# Patient Record
Sex: Female | Born: 1960 | Race: White | Hispanic: No | Marital: Married | Smoking: Never smoker
Health system: Southern US, Community
[De-identification: ages and names within clinical notes are randomized; demographics above are authoritative.]

## PROBLEM LIST (undated history)

## (undated) DIAGNOSIS — Z87442 Personal history of urinary calculi: Secondary | ICD-10-CM

## (undated) DIAGNOSIS — Z973 Presence of spectacles and contact lenses: Secondary | ICD-10-CM

## (undated) DIAGNOSIS — N201 Calculus of ureter: Secondary | ICD-10-CM

## (undated) DIAGNOSIS — K219 Gastro-esophageal reflux disease without esophagitis: Secondary | ICD-10-CM

## (undated) HISTORY — PX: TUBAL LIGATION: SHX77

## (undated) HISTORY — PX: PICC LINE INSERTION: CATH118290

---

## 1992-07-01 HISTORY — PX: LAPAROSCOPIC TUBAL LIGATION: SUR803

## 1998-07-01 HISTORY — PX: OTHER SURGICAL HISTORY: SHX169

## 1999-07-02 HISTORY — PX: OTHER SURGICAL HISTORY: SHX169

## 2012-04-22 ENCOUNTER — Other Ambulatory Visit (HOSPITAL_COMMUNITY)
Admission: RE | Admit: 2012-04-22 | Discharge: 2012-04-22 | Disposition: A | Discharge status: Home / Self Care | Payer: BC Managed Care – PPO | Source: Ambulatory Visit | Attending: Family Medicine | Admitting: Family Medicine

## 2012-04-22 DIAGNOSIS — Z124 Encounter for screening for malignant neoplasm of cervix: Secondary | ICD-10-CM | POA: Insufficient documentation

## 2012-04-28 ENCOUNTER — Other Ambulatory Visit: Payer: Self-pay | Admitting: Family Medicine

## 2012-04-28 DIAGNOSIS — Z1231 Encounter for screening mammogram for malignant neoplasm of breast: Secondary | ICD-10-CM

## 2012-05-25 ENCOUNTER — Ambulatory Visit
Admission: RE | Admit: 2012-05-25 | Discharge: 2012-05-25 | Disposition: A | Discharge status: Home / Self Care | Payer: BC Managed Care – PPO | Source: Ambulatory Visit | Attending: Family Medicine | Admitting: Family Medicine

## 2012-05-25 DIAGNOSIS — Z1231 Encounter for screening mammogram for malignant neoplasm of breast: Secondary | ICD-10-CM

## 2013-07-07 ENCOUNTER — Other Ambulatory Visit: Payer: Self-pay

## 2013-07-07 DIAGNOSIS — Z1231 Encounter for screening mammogram for malignant neoplasm of breast: Secondary | ICD-10-CM

## 2013-07-26 ENCOUNTER — Ambulatory Visit
Admission: RE | Admit: 2013-07-26 | Discharge: 2013-07-26 | Disposition: A | Discharge status: Home / Self Care | Payer: BC Managed Care – PPO | Source: Ambulatory Visit

## 2013-07-26 DIAGNOSIS — Z1231 Encounter for screening mammogram for malignant neoplasm of breast: Secondary | ICD-10-CM

## 2013-07-29 ENCOUNTER — Other Ambulatory Visit: Payer: Self-pay | Admitting: Family Medicine

## 2013-07-29 DIAGNOSIS — R928 Other abnormal and inconclusive findings on diagnostic imaging of breast: Secondary | ICD-10-CM

## 2013-07-30 ENCOUNTER — Ambulatory Visit
Admission: RE | Admit: 2013-07-30 | Discharge: 2013-07-30 | Disposition: A | Discharge status: Home / Self Care | Payer: BC Managed Care – PPO | Source: Ambulatory Visit | Attending: Family Medicine | Admitting: Family Medicine

## 2013-07-30 DIAGNOSIS — R928 Other abnormal and inconclusive findings on diagnostic imaging of breast: Secondary | ICD-10-CM

## 2013-08-09 ENCOUNTER — Other Ambulatory Visit: Payer: BC Managed Care – PPO

## 2015-05-29 ENCOUNTER — Other Ambulatory Visit (HOSPITAL_COMMUNITY)
Admission: RE | Admit: 2015-05-29 | Discharge: 2015-05-29 | Disposition: A | Discharge status: Home / Self Care | Payer: BLUE CROSS/BLUE SHIELD | Source: Ambulatory Visit | Attending: Family Medicine | Admitting: Family Medicine

## 2015-05-29 ENCOUNTER — Other Ambulatory Visit: Payer: Self-pay | Admitting: Family Medicine

## 2015-05-29 DIAGNOSIS — Z124 Encounter for screening for malignant neoplasm of cervix: Secondary | ICD-10-CM | POA: Diagnosis not present

## 2015-05-30 LAB — CYTOLOGY - PAP

## 2016-01-09 ENCOUNTER — Emergency Department (HOSPITAL_BASED_OUTPATIENT_CLINIC_OR_DEPARTMENT_OTHER)
Admission: EM | Admit: 2016-01-09 | Discharge: 2016-01-09 | Disposition: A | Discharge status: Home / Self Care | Payer: No Typology Code available for payment source | Attending: Emergency Medicine | Admitting: Emergency Medicine

## 2016-01-09 ENCOUNTER — Encounter (HOSPITAL_BASED_OUTPATIENT_CLINIC_OR_DEPARTMENT_OTHER): Payer: Self-pay | Admitting: *Deleted

## 2016-01-09 ENCOUNTER — Emergency Department (HOSPITAL_BASED_OUTPATIENT_CLINIC_OR_DEPARTMENT_OTHER): Payer: No Typology Code available for payment source

## 2016-01-09 DIAGNOSIS — N200 Calculus of kidney: Secondary | ICD-10-CM | POA: Insufficient documentation

## 2016-01-09 DIAGNOSIS — R1032 Left lower quadrant pain: Secondary | ICD-10-CM | POA: Diagnosis present

## 2016-01-09 LAB — CBC WITH DIFFERENTIAL/PLATELET
Basophils Absolute: 0 10*3/uL (ref 0.0–0.1)
Basophils Relative: 1 %
Eosinophils Absolute: 0.2 10*3/uL (ref 0.0–0.7)
Eosinophils Relative: 2 %
HCT: 48 % — ABNORMAL HIGH (ref 36.0–46.0)
Hemoglobin: 15.8 g/dL — ABNORMAL HIGH (ref 12.0–15.0)
Lymphocytes Relative: 28 %
Lymphs Abs: 2.4 10*3/uL (ref 0.7–4.0)
MCH: 29.2 pg (ref 26.0–34.0)
MCHC: 32.9 g/dL (ref 30.0–36.0)
MCV: 88.6 fL (ref 78.0–100.0)
Monocytes Absolute: 0.6 10*3/uL (ref 0.1–1.0)
Monocytes Relative: 7 %
Neutro Abs: 5.4 10*3/uL (ref 1.7–7.7)
Neutrophils Relative %: 62 %
Platelets: 270 10*3/uL (ref 150–400)
RBC: 5.42 MIL/uL — ABNORMAL HIGH (ref 3.87–5.11)
RDW: 14.2 % (ref 11.5–15.5)
WBC: 8.7 10*3/uL (ref 4.0–10.5)

## 2016-01-09 LAB — COMPREHENSIVE METABOLIC PANEL
ALT: 74 U/L — ABNORMAL HIGH (ref 14–54)
AST: 51 U/L — ABNORMAL HIGH (ref 15–41)
Albumin: 4.6 g/dL (ref 3.5–5.0)
Alkaline Phosphatase: 65 U/L (ref 38–126)
Anion gap: 8 (ref 5–15)
BUN: 16 mg/dL (ref 6–20)
CO2: 25 mmol/L (ref 22–32)
Calcium: 10.5 mg/dL — ABNORMAL HIGH (ref 8.9–10.3)
Chloride: 107 mmol/L (ref 101–111)
Creatinine, Ser: 0.96 mg/dL (ref 0.44–1.00)
GFR calc Af Amer: >60 mL/min (ref >60)
GFR calc non Af Amer: >60 mL/min (ref >60)
Glucose, Bld: 156 mg/dL — ABNORMAL HIGH (ref 65–99)
Potassium: 4.1 mmol/L (ref 3.5–5.1)
Sodium: 140 mmol/L (ref 135–145)
Total Bilirubin: 1 mg/dL (ref 0.3–1.2)
Total Protein: 7.1 g/dL (ref 6.5–8.1)

## 2016-01-09 LAB — URINALYSIS, ROUTINE W REFLEX MICROSCOPIC
Bilirubin Urine: NEGATIVE
Glucose, UA: NEGATIVE mg/dL
Ketones, ur: NEGATIVE mg/dL
Leukocytes, UA: NEGATIVE
Nitrite: NEGATIVE
Protein, ur: NEGATIVE mg/dL
Specific Gravity, Urine: 1.02 (ref 1.005–1.030)
pH: 5.5 (ref 5.0–8.0)

## 2016-01-09 LAB — URINE MICROSCOPIC-ADD ON

## 2016-01-09 MED ORDER — TAMSULOSIN HCL 0.4 MG PO CAPS: 0.4000 mg | ORAL_CAPSULE | Freq: Every day | ORAL | Status: DC

## 2016-01-09 MED ORDER — KETOROLAC TROMETHAMINE 15 MG/ML IJ SOLN
15.0000 mg | Freq: Once | INTRAMUSCULAR | Status: AC
  Administered 2016-01-09: 15 mg via INTRAVENOUS
  Filled 2016-01-09: qty 1

## 2016-01-09 MED ORDER — FENTANYL CITRATE (PF) 100 MCG/2ML IJ SOLN
50.0000 ug | Freq: Once | INTRAMUSCULAR | Status: AC
  Administered 2016-01-09: 50 ug via INTRAVENOUS
  Filled 2016-01-09: qty 2

## 2016-01-09 MED ORDER — ONDANSETRON HCL 4 MG/2ML IJ SOLN
4.0000 mg | Freq: Once | INTRAMUSCULAR | Status: AC
  Administered 2016-01-09: 4 mg via INTRAVENOUS
  Filled 2016-01-09: qty 2

## 2016-01-09 MED ORDER — HYDROCODONE-ACETAMINOPHEN 5-325 MG PO TABS: 1.0000 | ORAL_TABLET | Freq: Four times a day (QID) | ORAL | Status: DC | PRN

## 2016-01-09 MED ORDER — NAPROXEN 500 MG PO TABS: 500.0000 mg | ORAL_TABLET | Freq: Three times a day (TID) | ORAL | Status: DC

## 2016-01-09 MED ORDER — ONDANSETRON 4 MG PO TBDP
4.0000 mg | ORAL_TABLET | Freq: Three times a day (TID) | ORAL | Status: DC | PRN
Start: 2016-01-09 — End: 2020-08-15

## 2016-01-09 MED ORDER — MORPHINE SULFATE (PF) 4 MG/ML IV SOLN
4.0000 mg | Freq: Once | INTRAVENOUS | Status: AC
  Administered 2016-01-09: 4 mg via INTRAVENOUS
  Filled 2016-01-09: qty 1

## 2016-01-09 MED ORDER — SODIUM CHLORIDE 0.9 % IV BOLUS (SEPSIS)
1000.0000 mL | Freq: Once | INTRAVENOUS | Status: AC
  Administered 2016-01-09: 1000 mL via INTRAVENOUS

## 2016-01-09 MED ORDER — HYDROMORPHONE HCL 1 MG/ML IJ SOLN
1.0000 mg | Freq: Once | INTRAMUSCULAR | Status: DC
  Filled 2016-01-09: qty 1

## 2016-01-09 MED FILL — TAMSULOSIN HCL 0.4 MG CAP: 0.4 | 30 days supply | Qty: 30 | Fill #0

## 2016-01-09 MED FILL — ONDANSETRON ODT 4 MG TABLET: 4 | 30 days supply | Qty: 15 | Fill #0

## 2016-01-09 MED FILL — NAPROXEN 500 MG TABLET: 500 | 10 days supply | Qty: 30 | Fill #0

## 2016-01-09 MED FILL — HYDROCODON-APAP 5-325: 5-325 | 3 days supply | Qty: 14 | Fill #0

## 2016-01-09 NOTE — ED Notes (Signed)
Pt reports sudden onset of abd pain LLQ with nausea and vomiting

## 2016-01-09 NOTE — Discharge Instructions (Signed)
Kidney Stones °Kidney stones (urolithiasis) are deposits that form inside your kidneys. The intense pain is caused by the stone moving through the urinary tract. When the stone moves, the ureter goes into spasm around the stone. The stone is usually passed in the urine.  °CAUSES  °· A disorder that makes certain neck glands produce too much parathyroid hormone (primary hyperparathyroidism). °· A buildup of uric acid crystals, similar to gout in your joints. °· Narrowing (stricture) of the ureter. °· A kidney obstruction present at birth (congenital obstruction). °· Previous surgery on the kidney or ureters. °· Numerous kidney infections. °SYMPTOMS  °· Feeling sick to your stomach (nauseous). °· Throwing up (vomiting). °· Blood in the urine (hematuria). °· Pain that usually spreads (radiates) to the groin. °· Frequency or urgency of urination. °DIAGNOSIS  °· Taking a history and physical exam. °· Blood or urine tests. °· CT scan. °· Occasionally, an examination of the inside of the urinary bladder (cystoscopy) is performed. °TREATMENT  °· Observation. °· Increasing your fluid intake. °· Extracorporeal shock wave lithotripsy--This is a noninvasive procedure that uses shock waves to break up kidney stones. °· Surgery may be needed if you have severe pain or persistent obstruction. There are various surgical procedures. Most of the procedures are performed with the use of small instruments. Only small incisions are needed to accommodate these instruments, so recovery time is minimized. °The size, location, and chemical composition are all important variables that will determine the proper choice of action for you. Talk to your health care provider to better understand your situation so that you will minimize the risk of injury to yourself and your kidney.  °HOME CARE INSTRUCTIONS  °· Drink enough water and fluids to keep your urine clear or pale yellow. This will help you to pass the stone or stone fragments. °· Strain  all urine through the provided strainer. Keep all particulate matter and stones for your health care provider to see. The stone causing the pain may be as small as a grain of salt. It is very important to use the strainer each and every time you pass your urine. The collection of your stone will allow your health care provider to analyze it and verify that a stone has actually passed. The stone analysis will often identify what you can do to reduce the incidence of recurrences. °· Only take over-the-counter or prescription medicines for pain, discomfort, or fever as directed by your health care provider. °· Keep all follow-up visits as told by your health care provider. This is important. °· Get follow-up X-rays if required. The absence of pain does not always mean that the stone has passed. It may have only stopped moving. If the urine remains completely obstructed, it can cause loss of kidney function or even complete destruction of the kidney. It is your responsibility to make sure X-rays and follow-ups are completed. Ultrasounds of the kidney can show blockages and the status of the kidney. Ultrasounds are not associated with any radiation and can be performed easily in a matter of minutes. °· Make changes to your daily diet as told by your health care provider. You may be told to: °¨ Limit the amount of salt that you eat. °¨ Eat 5 or more servings of fruits and vegetables each day. °¨ Limit the amount of meat, poultry, fish, and eggs that you eat. °· Collect a 24-hour urine sample as told by your health care provider. You may need to collect another urine sample every 6-12   months. °SEEK MEDICAL CARE IF: °· You experience pain that is progressive and unresponsive to any pain medicine you have been prescribed. °SEEK IMMEDIATE MEDICAL CARE IF:  °· Pain cannot be controlled with the prescribed medicine. °· You have a fever or shaking chills. °· The severity or intensity of pain increases over 18 hours and is not  relieved by pain medicine. °· You develop a new onset of abdominal pain. °· You feel faint or pass out. °· You are unable to urinate. °  °This information is not intended to replace advice given to you by your health care provider. Make sure you discuss any questions you have with your health care provider. °  °Document Released: 06/17/2005 Document Revised: 03/08/2015 Document Reviewed: 11/18/2012 °Elsevier Interactive Patient Education ©2016 [employer] ° °

## 2016-01-09 NOTE — ED Provider Notes (Signed)
CSN: [id number]     Arrival date & time 01/09/16  4098 History   First MD Initiated Contact with Patient 01/09/16 [phone]     Chief Complaint  Patient presents with  . Flank Pain     (Consider location/radiation/quality/duration/timing/severity/associated sxs/prior Treatment) HPI   LLq pain began at 4AM, associated n/v, combination of sharp/dull/cramping. Waxing and waning however constant.  THought it was gas and took tums which did not help.  Severe pain.   No diarrhea  History reviewed. No pertinent past medical history. Past Surgical History  Procedure Laterality Date  . Tubal ligation    . Fracture surgery     History reviewed. No pertinent family history. Social History  Substance Use Topics  . Smoking status: Never Smoker   . Smokeless tobacco: Never Used  . Alcohol Use: No   OB History    No data available     Review of Systems  Constitutional: Negative for fever.  HENT: Negative for sore throat.   Eyes: Negative for visual disturbance.  Respiratory: Negative for cough and shortness of breath.   Cardiovascular: Negative for chest pain.  Gastrointestinal: Positive for nausea, vomiting and abdominal pain. Negative for diarrhea and constipation.  Genitourinary: Positive for flank pain. Negative for dysuria and difficulty urinating.  Musculoskeletal: Negative for back pain and neck pain.  Skin: Negative for rash.  Neurological: Negative for syncope and headaches.      Allergies  Ibuprofen  Home Medications   Prior to Admission medications   Medication Sig Start Date End Date Taking? Authorizing Provider  HYDROcodone-acetaminophen (NORCO) 5-325 MG tablet Take 1 tablet by mouth every 6 (six) hours as needed for moderate pain. 01/09/16   Alvira Monday, MD  naproxen (NAPROSYN) 500 MG tablet Take 1 tablet (500 mg total) by mouth 3 (three) times daily with meals. 01/09/16   Alvira Monday, MD  ondansetron (ZOFRAN ODT) 4 MG disintegrating tablet Take 1 tablet (4 mg  total) by mouth every 8 (eight) hours as needed for nausea or vomiting. 01/09/16   Alvira Monday, MD  tamsulosin (FLOMAX) 0.4 MG CAPS capsule Take 1 capsule (0.4 mg total) by mouth daily. 01/09/16   Alvira Monday, MD   BP 157/80 mmHg  Pulse 69  Temp(Src) 98.4 F (36.9 C) (Oral)  Resp 18  Ht  (1.575 m)  Wt 252 lb (114.306 kg)  BMI 46.08 kg/m2  SpO2 94% Physical Exam  Constitutional: She is oriented to person, place, and time. She appears well-developed and well-nourished. No distress.  HENT:  Head: Normocephalic and atraumatic.  Eyes: Conjunctivae and EOM are normal.  Neck: Normal range of motion.  Cardiovascular: Normal rate, regular rhythm, normal heart sounds and intact distal pulses.  Exam reveals no gallop and no friction rub.   No murmur heard. Pulmonary/Chest: Effort normal and breath sounds normal. No respiratory distress. She has no wheezes. She has no rales.  Abdominal: Soft. She exhibits no distension. There is no tenderness. There is no guarding and no CVA tenderness.  Musculoskeletal: She exhibits no edema or tenderness.  Neurological: She is alert and oriented to person, place, and time.  Skin: Skin is warm and dry. No rash noted. She is not diaphoretic. No erythema.  Nursing note and vitals reviewed.   ED Course  Procedures (including critical care time) Labs Review Labs Reviewed  URINALYSIS, ROUTINE W REFLEX MICROSCOPIC (NOT AT [facility]) - Abnormal; Notable for the following:    APPearance CLOUDY (*)    Hgb urine dipstick LARGE (*)  All other components within normal limits  URINE MICROSCOPIC-ADD ON - Abnormal; Notable for the following:    Squamous Epithelial / LPF 6-30 (*)    Bacteria, UA MANY (*)    All other components within normal limits  CBC WITH DIFFERENTIAL/PLATELET - Abnormal; Notable for the following:    RBC 5.42 (*)    Hemoglobin 15.8 (*)    HCT 48.0 (*)    All other components within normal limits  COMPREHENSIVE METABOLIC PANEL -  Abnormal; Notable for the following:    Glucose, Bld 156 (*)    Calcium 10.5 (*)    AST 51 (*)    ALT 74 (*)    All other components within normal limits    Imaging Review Ct Renal Stone Study  01/09/2016  CLINICAL DATA:  Sudden onset of LEFT lower quadrant abdominal pain with nausea and vomiting EXAM: CT ABDOMEN AND PELVIS WITHOUT CONTRAST TECHNIQUE: Multidetector CT imaging of the abdomen and pelvis was performed following the standard protocol without IV contrast. Sagittal and coronal MPR images reconstructed from axial data set. COMPARISON:  None. FINDINGS: Lower chest:  Dependent atelectasis posterior RIGHT lower lobe base. Hepatobiliary: Gallbladder unremarkable. Probable fatty infiltration of liver with focal sparing adjacent to gallbladder fossa. Small low-attenuation hepatic foci consistent with cysts by attenuation measurement largest 12 mm greatest diameter. Pancreas: Normal appearance Spleen: Normal appearance Adrenals/Urinary Tract: Adrenal glands normal appearance. Mild fetal lobulation of kidneys. LEFT hydronephrosis, LEFT renal enlargement and mild peripelvic edema secondary to a 4 mm proximal LEFT ureteral calculus. Ureters otherwise decompressed without additional urinary tract calcification or evidence of RIGHT hydronephrosis. No renal mass. Bladder decompressed. Stomach/Bowel: Normal appendix. Colon un opacified and under distended, unable to exclude areas of wall thickening at several sites most notably distal transverse colon and splenic flexure. Stomach and small bowel loops unremarkable. Vascular/Lymphatic: Aorta normal caliber.  No adenopathy. Reproductive: Prior tubal ligation. Uterus and adnexa otherwise normal appearance. Other: No mass, free air, free fluid, or hernia. Musculoskeletal: Mild degenerative disc and facet disease changes lower lumbar spine. IMPRESSION: LEFT hydronephrosis due to a 4 mm proximal LEFT ureteral calculus. Probable fatty infiltration of liver with focal  sparing adjacent to gallbladder fossa and note of several small hepatic cysts. Electronically Signed   By: Ulyses SouthwardMark  Boles M.D.   On: 01/09/2016 08:06   I have personally reviewed and evaluated these images and lab results as part of my medical decision-making.   EKG Interpretation None      MDM   Final diagnoses:  Nephrolithiasis   55 year old female with no significant medical history presents with concern for left flank and left lower quadrant pain. CT stone study shows 4 mm obstructing nephrolithiasis with associated hydronephrosis. Patient was given morphine, Toradol and Zofran for pain and nausea in the emergency department and 1 L of normal saline. Urinalysis shows likely contamination with multiple squamous cells and many bacteria, however will send for culture.  Pain initially not controlled, and dilaudid ordered, however pain decreased to 0. Patient is given prescriptions for Flomax, naproxen, Norco and Zofran and recommend follow-up with urology if persistent pain and otherwise PCP follow up. Discussed return precautions. Patient discharged in stable condition with understanding of reasons to return.    Alvira MondayErin Lynnix Schoneman, MD 01/09/16 [id number]

## 2016-01-10 LAB — URINE CULTURE

## 2016-01-18 ENCOUNTER — Encounter (HOSPITAL_BASED_OUTPATIENT_CLINIC_OR_DEPARTMENT_OTHER): Payer: Self-pay | Admitting: *Deleted

## 2016-01-18 ENCOUNTER — Other Ambulatory Visit: Payer: Self-pay | Admitting: Urology

## 2016-01-18 NOTE — Progress Notes (Addendum)
NPO AFTER MN.  ARRIVE AT 0715.  NEEDS KUB.  CURRENT LAB RESULTS IN CHART AND EPIC.  WILL TAKE ZANTAC AND FLOMAX AM DOS W/ SIPS OF WATER AND IF NEEDED TAKE HYDROCODONE/ ZOFRAN .

## 2016-01-21 NOTE — Discharge Instructions (Signed)

## 2016-01-22 ENCOUNTER — Ambulatory Visit (HOSPITAL_BASED_OUTPATIENT_CLINIC_OR_DEPARTMENT_OTHER)
Admission: RE | Admit: 2016-01-22 | Payer: No Typology Code available for payment source | Source: Ambulatory Visit | Admitting: Urology

## 2016-01-22 ENCOUNTER — Encounter (HOSPITAL_BASED_OUTPATIENT_CLINIC_OR_DEPARTMENT_OTHER): Payer: Self-pay | Admitting: Anesthesiology

## 2016-01-22 HISTORY — DX: Calculus of ureter: N20.1

## 2016-01-22 HISTORY — DX: Gastro-esophageal reflux disease without esophagitis: K21.9

## 2016-01-22 HISTORY — DX: Presence of spectacles and contact lenses: Z97.3

## 2016-01-22 SURGERY — CYSTOURETEROSCOPY, WITH RETROGRADE PYELOGRAM AND STENT INSERTION
Anesthesia: General | Laterality: Left

## 2016-01-22 NOTE — Anesthesia Preprocedure Evaluation (Deleted)
Anesthesia Evaluation  Patient identified by MRN, date of birth, ID band Patient awake    Reviewed: Allergy & Precautions, NPO status , Patient's Chart, lab work & pertinent test results  History of Anesthesia Complications Negative for: history of anesthetic complications  Airway        Dental   Pulmonary neg pulmonary ROS,           Cardiovascular negative cardio ROS       Neuro/Psych negative neurological ROS  negative psych ROS   GI/Hepatic Neg liver ROS, GERD  Controlled,  Endo/Other  Morbid obesity  Renal/GU negative Renal ROS  negative genitourinary   Musculoskeletal negative musculoskeletal ROS (+)   Abdominal   Peds negative pediatric ROS (+)  Hematology negative hematology ROS (+)   Anesthesia Other Findings   Reproductive/Obstetrics negative OB ROS                             Anesthesia Physical Anesthesia Plan  ASA: III  Anesthesia Plan: General   Post-op Pain Management:    Induction: Intravenous  Airway Management Planned: LMA  Additional Equipment:   Intra-op Plan:   Post-operative Plan: Extubation in OR  Informed Consent: I have reviewed the patients History and Physical, chart, labs and discussed the procedure including the risks, benefits and alternatives for the proposed anesthesia with the patient or authorized representative who has indicated his/her understanding and acceptance.   Dental advisory given  Plan Discussed with: CRNA  Anesthesia Plan Comments:         Anesthesia Quick Evaluation

## 2016-06-03 ENCOUNTER — Other Ambulatory Visit: Payer: Self-pay | Admitting: Family Medicine

## 2016-06-03 DIAGNOSIS — N6320 Unspecified lump in the left breast, unspecified quadrant: Secondary | ICD-10-CM

## 2016-07-03 ENCOUNTER — Other Ambulatory Visit: Payer: No Typology Code available for payment source

## 2016-07-17 ENCOUNTER — Other Ambulatory Visit: Payer: No Typology Code available for payment source

## 2016-07-22 ENCOUNTER — Ambulatory Visit
Admission: RE | Admit: 2016-07-22 | Discharge: 2016-07-22 | Disposition: A | Discharge status: Home / Self Care | Payer: No Typology Code available for payment source | Source: Ambulatory Visit | Attending: Family Medicine | Admitting: Family Medicine

## 2016-07-22 DIAGNOSIS — N6320 Unspecified lump in the left breast, unspecified quadrant: Secondary | ICD-10-CM

## 2018-09-24 ENCOUNTER — Other Ambulatory Visit (HOSPITAL_COMMUNITY)
Admission: RE | Admit: 2018-09-24 | Discharge: 2018-09-24 | Disposition: A | Discharge status: Home / Self Care | Payer: PRIVATE HEALTH INSURANCE | Source: Ambulatory Visit | Attending: Family Medicine | Admitting: Family Medicine

## 2018-09-24 ENCOUNTER — Other Ambulatory Visit: Payer: Self-pay | Admitting: Family Medicine

## 2018-09-24 DIAGNOSIS — Z124 Encounter for screening for malignant neoplasm of cervix: Secondary | ICD-10-CM | POA: Diagnosis not present

## 2018-09-25 LAB — CYTOLOGY - PAP: Diagnosis: NEGATIVE

## 2019-10-05 ENCOUNTER — Ambulatory Visit
Admission: RE | Admit: 2019-10-05 | Discharge: 2019-10-05 | Disposition: A | Discharge status: Home / Self Care | Payer: PRIVATE HEALTH INSURANCE | Source: Ambulatory Visit | Attending: Family Medicine | Admitting: Family Medicine

## 2019-10-05 ENCOUNTER — Other Ambulatory Visit: Payer: Self-pay | Admitting: Family Medicine

## 2019-10-05 DIAGNOSIS — R319 Hematuria, unspecified: Secondary | ICD-10-CM

## 2019-10-05 DIAGNOSIS — M5489 Other dorsalgia: Secondary | ICD-10-CM

## 2019-12-16 ENCOUNTER — Other Ambulatory Visit (HOSPITAL_COMMUNITY): Payer: Self-pay | Admitting: Urology

## 2019-12-16 DIAGNOSIS — R8271 Bacteriuria: Secondary | ICD-10-CM

## 2019-12-17 ENCOUNTER — Other Ambulatory Visit (HOSPITAL_COMMUNITY): Payer: Self-pay

## 2019-12-20 ENCOUNTER — Ambulatory Visit (HOSPITAL_COMMUNITY)
Admission: RE | Admit: 2019-12-20 | Discharge: 2019-12-20 | Disposition: A | Discharge status: Home / Self Care | Payer: No Typology Code available for payment source | Source: Ambulatory Visit | Attending: Urology | Admitting: Urology

## 2019-12-20 ENCOUNTER — Other Ambulatory Visit: Payer: Self-pay

## 2019-12-20 DIAGNOSIS — R8271 Bacteriuria: Secondary | ICD-10-CM | POA: Diagnosis present

## 2019-12-20 MED ORDER — LIDOCAINE HCL 1 % IJ SOLN
INTRAMUSCULAR | Status: AC
  Filled 2019-12-20: qty 20

## 2019-12-20 MED ORDER — HEPARIN SOD (PORK) LOCK FLUSH 100 UNIT/ML IV SOLN: 250.0000 [IU] | INTRAVENOUS | Status: DC | PRN

## 2019-12-20 MED ORDER — HEPARIN SOD (PORK) LOCK FLUSH 100 UNIT/ML IV SOLN
INTRAVENOUS | Status: AC
  Administered 2019-12-20: 250 [IU]
  Filled 2019-12-20: qty 5

## 2019-12-20 MED ORDER — SODIUM CHLORIDE 0.9 % IV SOLN
2.0000 g | Freq: Once | INTRAVENOUS | Status: DC
  Administered 2019-12-20: 2 g via INTRAVENOUS
  Filled 2019-12-20: qty 2

## 2019-12-20 NOTE — Procedures (Signed)
PROCEDURE SUMMARY:  Successful placement of image-guided single lumen PICC line to the right basilic vein. Length 35 cm. Tip at lower SVC/RA. No complications. EBL = <5 ml Ready for use.  Please see imaging section of Epic for full dictation.   Mickie Kay, NP 12/20/2019 11:41 AM

## 2019-12-21 ENCOUNTER — Ambulatory Visit (HOSPITAL_COMMUNITY)
Admission: RE | Admit: 2019-12-21 | Discharge: 2019-12-21 | Disposition: A | Discharge status: Home / Self Care | Payer: PRIVATE HEALTH INSURANCE | Source: Ambulatory Visit | Attending: Urology | Admitting: Urology

## 2019-12-21 DIAGNOSIS — Z452 Encounter for adjustment and management of vascular access device: Secondary | ICD-10-CM | POA: Diagnosis present

## 2019-12-21 LAB — CBC WITH DIFFERENTIAL/PLATELET
Abs Immature Granulocytes: 0.02 10*3/uL (ref 0.00–0.07)
Basophils Absolute: 0 10*3/uL (ref 0.0–0.1)
Basophils Relative: 1 %
Eosinophils Absolute: 0.3 10*3/uL (ref 0.0–0.5)
Eosinophils Relative: 4 %
HCT: 45.4 % (ref 36.0–46.0)
Hemoglobin: 14 g/dL (ref 12.0–15.0)
Immature Granulocytes: 0 %
Lymphocytes Relative: 38 %
Lymphs Abs: 2.5 10*3/uL (ref 0.7–4.0)
MCH: 26.5 pg (ref 26.0–34.0)
MCHC: 30.8 g/dL (ref 30.0–36.0)
MCV: 85.8 fL (ref 80.0–100.0)
Monocytes Absolute: 0.7 10*3/uL (ref 0.1–1.0)
Monocytes Relative: 10 %
Neutro Abs: 3.2 10*3/uL (ref 1.7–7.7)
Neutrophils Relative %: 47 %
Platelets: 284 10*3/uL (ref 150–400)
RBC: 5.29 MIL/uL — ABNORMAL HIGH (ref 3.87–5.11)
RDW: 14.7 % (ref 11.5–15.5)
WBC: 6.6 10*3/uL (ref 4.0–10.5)
nRBC: 0 % (ref 0.0–0.2)

## 2019-12-21 LAB — BASIC METABOLIC PANEL
Anion gap: 9 (ref 5–15)
BUN: 14 mg/dL (ref 6–20)
CO2: 24 mmol/L (ref 22–32)
Calcium: 10.1 mg/dL (ref 8.9–10.3)
Chloride: 108 mmol/L (ref 98–111)
Creatinine, Ser: 0.75 mg/dL (ref 0.44–1.00)
GFR calc Af Amer: >60 mL/min (ref >60)
GFR calc non Af Amer: >60 mL/min (ref >60)
Glucose, Bld: 97 mg/dL (ref 70–99)
Potassium: 3.9 mmol/L (ref 3.5–5.1)
Sodium: 141 mmol/L (ref 135–145)

## 2019-12-21 MED ORDER — SODIUM CHLORIDE 0.9 % IV SOLN
2.0000 g | Freq: Once | INTRAVENOUS | Status: DC
  Administered 2019-12-21: 2 g via INTRAVENOUS
  Filled 2019-12-21: qty 2

## 2019-12-21 MED ORDER — HEPARIN SOD (PORK) LOCK FLUSH 100 UNIT/ML IV SOLN: 250.0000 [IU] | INTRAVENOUS | Status: DC | PRN

## 2019-12-21 MED ORDER — HEPARIN SOD (PORK) LOCK FLUSH 100 UNIT/ML IV SOLN
INTRAVENOUS | Status: AC
  Administered 2019-12-21: 250 [IU]
  Filled 2019-12-21: qty 5

## 2019-12-22 ENCOUNTER — Ambulatory Visit (HOSPITAL_COMMUNITY)
Admission: RE | Admit: 2019-12-22 | Discharge: 2019-12-22 | Disposition: A | Discharge status: Home / Self Care | Payer: PRIVATE HEALTH INSURANCE | Source: Ambulatory Visit | Attending: Urology | Admitting: Urology

## 2019-12-22 ENCOUNTER — Other Ambulatory Visit: Payer: Self-pay

## 2019-12-22 DIAGNOSIS — N3021 Other chronic cystitis with hematuria: Secondary | ICD-10-CM | POA: Insufficient documentation

## 2019-12-22 MED ORDER — SODIUM CHLORIDE 0.9 % IV SOLN
2.0000 g | Freq: Once | INTRAVENOUS | Status: DC
  Administered 2019-12-22: 2 g via INTRAVENOUS

## 2019-12-22 MED ORDER — HEPARIN SOD (PORK) LOCK FLUSH 100 UNIT/ML IV SOLN
250.0000 [IU] | INTRAVENOUS | Status: DC | PRN
  Administered 2019-12-22: 250 [IU]

## 2019-12-23 ENCOUNTER — Other Ambulatory Visit: Payer: Self-pay

## 2019-12-23 ENCOUNTER — Ambulatory Visit (HOSPITAL_COMMUNITY)
Admission: RE | Admit: 2019-12-23 | Discharge: 2019-12-23 | Disposition: A | Discharge status: Home / Self Care | Payer: No Typology Code available for payment source | Source: Ambulatory Visit | Attending: Urology | Admitting: Urology

## 2019-12-23 DIAGNOSIS — B9620 Unspecified Escherichia coli [E. coli] as the cause of diseases classified elsewhere: Secondary | ICD-10-CM | POA: Diagnosis not present

## 2019-12-23 DIAGNOSIS — N3020 Other chronic cystitis without hematuria: Secondary | ICD-10-CM | POA: Insufficient documentation

## 2019-12-23 DIAGNOSIS — Z1612 Extended spectrum beta lactamase (ESBL) resistance: Secondary | ICD-10-CM | POA: Insufficient documentation

## 2019-12-23 MED ORDER — HEPARIN SOD (PORK) LOCK FLUSH 100 UNIT/ML IV SOLN
250.0000 [IU] | INTRAVENOUS | Status: DC | PRN
  Administered 2019-12-23: 250 [IU]

## 2019-12-23 MED ORDER — SODIUM CHLORIDE 0.9 % IV SOLN
2.0000 g | Freq: Once | INTRAVENOUS | Status: DC
  Administered 2019-12-23: 2 g via INTRAVENOUS

## 2019-12-24 ENCOUNTER — Other Ambulatory Visit: Payer: Self-pay

## 2019-12-24 ENCOUNTER — Ambulatory Visit (HOSPITAL_COMMUNITY)
Admission: RE | Admit: 2019-12-24 | Discharge: 2019-12-24 | Disposition: A | Discharge status: Home / Self Care | Payer: No Typology Code available for payment source | Source: Ambulatory Visit | Attending: Urology | Admitting: Urology

## 2019-12-24 DIAGNOSIS — N3020 Other chronic cystitis without hematuria: Secondary | ICD-10-CM | POA: Diagnosis not present

## 2019-12-24 MED ORDER — HEPARIN SOD (PORK) LOCK FLUSH 100 UNIT/ML IV SOLN: 250.0000 [IU] | INTRAVENOUS | Status: DC | PRN

## 2019-12-24 MED ORDER — SODIUM CHLORIDE 0.9 % IV SOLN
2.0000 g | Freq: Once | INTRAVENOUS | Status: AC
  Administered 2019-12-24: 2 g via INTRAVENOUS

## 2019-12-24 MED ORDER — HEPARIN SOD (PORK) LOCK FLUSH 100 UNIT/ML IV SOLN
INTRAVENOUS | Status: AC
  Administered 2019-12-24: 250 [IU]
  Filled 2019-12-24: qty 5

## 2019-12-25 ENCOUNTER — Ambulatory Visit (HOSPITAL_COMMUNITY)
Admission: RE | Admit: 2019-12-25 | Discharge: 2019-12-25 | Disposition: A | Discharge status: Home / Self Care | Payer: No Typology Code available for payment source | Source: Ambulatory Visit | Attending: Urology | Admitting: Urology

## 2019-12-25 DIAGNOSIS — N3020 Other chronic cystitis without hematuria: Secondary | ICD-10-CM | POA: Diagnosis not present

## 2019-12-25 MED ORDER — SODIUM CHLORIDE 0.9 % IV SOLN
2.0000 g | Freq: Once | INTRAVENOUS | Status: AC
  Administered 2019-12-25: 2 g via INTRAVENOUS
  Filled 2019-12-25: qty 2

## 2019-12-25 MED ORDER — HEPARIN SOD (PORK) LOCK FLUSH 100 UNIT/ML IV SOLN
250.0000 [IU] | INTRAVENOUS | Status: DC | PRN
  Administered 2019-12-25: 250 [IU]

## 2019-12-26 ENCOUNTER — Ambulatory Visit (HOSPITAL_COMMUNITY)
Admission: RE | Admit: 2019-12-26 | Discharge: 2019-12-26 | Disposition: A | Discharge status: Home / Self Care | Payer: No Typology Code available for payment source | Source: Ambulatory Visit | Attending: Urology | Admitting: Urology

## 2019-12-26 DIAGNOSIS — N3020 Other chronic cystitis without hematuria: Secondary | ICD-10-CM | POA: Diagnosis not present

## 2019-12-26 MED ORDER — HEPARIN SOD (PORK) LOCK FLUSH 100 UNIT/ML IV SOLN
250.0000 [IU] | INTRAVENOUS | Status: DC | PRN
  Administered 2019-12-26: 250 [IU]

## 2019-12-26 MED ORDER — SODIUM CHLORIDE 0.9 % IV SOLN
2.0000 g | Freq: Once | INTRAVENOUS | Status: AC
  Administered 2019-12-26: 2 g via INTRAVENOUS
  Filled 2019-12-26: qty 2

## 2019-12-27 ENCOUNTER — Other Ambulatory Visit: Payer: Self-pay

## 2019-12-27 ENCOUNTER — Ambulatory Visit (HOSPITAL_COMMUNITY)
Admission: RE | Admit: 2019-12-27 | Discharge: 2019-12-27 | Disposition: A | Discharge status: Home / Self Care | Payer: No Typology Code available for payment source | Source: Ambulatory Visit | Attending: Urology | Admitting: Urology

## 2019-12-27 DIAGNOSIS — N3020 Other chronic cystitis without hematuria: Secondary | ICD-10-CM | POA: Diagnosis not present

## 2019-12-27 MED ORDER — SODIUM CHLORIDE 0.9 % IV SOLN: 2.0000 g | Freq: Once | INTRAVENOUS | Status: DC

## 2019-12-27 MED ORDER — HEPARIN SOD (PORK) LOCK FLUSH 100 UNIT/ML IV SOLN
INTRAVENOUS | Status: AC
  Filled 2019-12-27: qty 5

## 2019-12-27 MED ORDER — HEPARIN SOD (PORK) LOCK FLUSH 100 UNIT/ML IV SOLN
250.0000 [IU] | INTRAVENOUS | Status: DC | PRN
  Administered 2019-12-27: 250 [IU]

## 2019-12-27 MED ORDER — SODIUM CHLORIDE 0.9 % IV SOLN
INTRAVENOUS | Status: AC
  Administered 2019-12-27: 2 g via INTRAVENOUS
  Filled 2019-12-27: qty 2

## 2019-12-28 ENCOUNTER — Ambulatory Visit (HOSPITAL_COMMUNITY)
Admission: RE | Admit: 2019-12-28 | Discharge: 2019-12-28 | Disposition: A | Discharge status: Home / Self Care | Payer: No Typology Code available for payment source | Source: Ambulatory Visit | Attending: Urology | Admitting: Urology

## 2019-12-28 ENCOUNTER — Other Ambulatory Visit: Payer: Self-pay

## 2019-12-28 DIAGNOSIS — N3020 Other chronic cystitis without hematuria: Secondary | ICD-10-CM | POA: Diagnosis not present

## 2019-12-28 LAB — CBC WITH DIFFERENTIAL/PLATELET
Abs Immature Granulocytes: 0.01 10*3/uL (ref 0.00–0.07)
Basophils Absolute: 0.1 10*3/uL (ref 0.0–0.1)
Basophils Relative: 1 %
Eosinophils Absolute: 0.3 10*3/uL (ref 0.0–0.5)
Eosinophils Relative: 5 %
HCT: 45.3 % (ref 36.0–46.0)
Hemoglobin: 14.1 g/dL (ref 12.0–15.0)
Immature Granulocytes: 0 %
Lymphocytes Relative: 33 %
Lymphs Abs: 2.2 10*3/uL (ref 0.7–4.0)
MCH: 26.7 pg (ref 26.0–34.0)
MCHC: 31.1 g/dL (ref 30.0–36.0)
MCV: 85.6 fL (ref 80.0–100.0)
Monocytes Absolute: 0.7 10*3/uL (ref 0.1–1.0)
Monocytes Relative: 10 %
Neutro Abs: 3.4 10*3/uL (ref 1.7–7.7)
Neutrophils Relative %: 51 %
Platelets: 289 10*3/uL (ref 150–400)
RBC: 5.29 MIL/uL — ABNORMAL HIGH (ref 3.87–5.11)
RDW: 15 % (ref 11.5–15.5)
WBC: 6.6 10*3/uL (ref 4.0–10.5)
nRBC: 0 % (ref 0.0–0.2)

## 2019-12-28 LAB — BASIC METABOLIC PANEL
Anion gap: 9 (ref 5–15)
BUN: 9 mg/dL (ref 6–20)
CO2: 26 mmol/L (ref 22–32)
Calcium: 10.2 mg/dL (ref 8.9–10.3)
Chloride: 106 mmol/L (ref 98–111)
Creatinine, Ser: 0.8 mg/dL (ref 0.44–1.00)
GFR calc Af Amer: >60 mL/min (ref >60)
GFR calc non Af Amer: >60 mL/min (ref >60)
Glucose, Bld: 100 mg/dL — ABNORMAL HIGH (ref 70–99)
Potassium: 4.2 mmol/L (ref 3.5–5.1)
Sodium: 141 mmol/L (ref 135–145)

## 2019-12-28 MED ORDER — HEPARIN SOD (PORK) LOCK FLUSH 100 UNIT/ML IV SOLN: 250.0000 [IU] | INTRAVENOUS | Status: DC | PRN

## 2019-12-28 MED ORDER — SODIUM CHLORIDE 0.9 % IV SOLN: 2.0000 g | Freq: Once | INTRAVENOUS | Status: DC

## 2019-12-28 MED ORDER — SODIUM CHLORIDE 0.9 % IV SOLN
INTRAVENOUS | Status: AC
  Administered 2019-12-28: 2 g via INTRAVENOUS
  Filled 2019-12-28: qty 2

## 2019-12-28 MED ORDER — HEPARIN SOD (PORK) LOCK FLUSH 100 UNIT/ML IV SOLN
INTRAVENOUS | Status: AC
  Administered 2019-12-28: 250 [IU]
  Filled 2019-12-28: qty 5

## 2019-12-29 ENCOUNTER — Other Ambulatory Visit: Payer: Self-pay

## 2019-12-29 ENCOUNTER — Ambulatory Visit (HOSPITAL_COMMUNITY)
Admission: RE | Admit: 2019-12-29 | Discharge: 2019-12-29 | Disposition: A | Discharge status: Home / Self Care | Payer: No Typology Code available for payment source | Source: Ambulatory Visit | Attending: Urology | Admitting: Urology

## 2019-12-29 DIAGNOSIS — N3020 Other chronic cystitis without hematuria: Secondary | ICD-10-CM | POA: Diagnosis not present

## 2019-12-29 MED ORDER — SODIUM CHLORIDE 0.9 % IV SOLN
2.0000 g | Freq: Once | INTRAVENOUS | Status: AC
  Administered 2019-12-29: 2 g via INTRAVENOUS
  Filled 2019-12-29: qty 2

## 2019-12-29 MED ORDER — HEPARIN SOD (PORK) LOCK FLUSH 100 UNIT/ML IV SOLN: 250.0000 [IU] | INTRAVENOUS | Status: DC | PRN

## 2019-12-29 NOTE — Progress Notes (Signed)
Picc line removed from right [facility] by IV team.  Dressing CDI at DC

## 2020-04-25 ENCOUNTER — Other Ambulatory Visit (HOSPITAL_COMMUNITY): Payer: Self-pay | Admitting: Family Medicine

## 2020-05-09 ENCOUNTER — Other Ambulatory Visit (HOSPITAL_COMMUNITY): Payer: Self-pay | Admitting: Family Medicine

## 2020-08-12 ENCOUNTER — Emergency Department (HOSPITAL_BASED_OUTPATIENT_CLINIC_OR_DEPARTMENT_OTHER)
Admission: EM | Admit: 2020-08-12 | Discharge: 2020-08-13 | Disposition: A | Discharge status: Home / Self Care | Payer: No Typology Code available for payment source | Source: Home / Self Care | Attending: Emergency Medicine | Admitting: Emergency Medicine

## 2020-08-12 ENCOUNTER — Other Ambulatory Visit: Payer: Self-pay

## 2020-08-12 ENCOUNTER — Emergency Department (HOSPITAL_BASED_OUTPATIENT_CLINIC_OR_DEPARTMENT_OTHER): Payer: No Typology Code available for payment source

## 2020-08-12 ENCOUNTER — Encounter (HOSPITAL_BASED_OUTPATIENT_CLINIC_OR_DEPARTMENT_OTHER): Payer: Self-pay | Admitting: Emergency Medicine

## 2020-08-12 DIAGNOSIS — Z87442 Personal history of urinary calculi: Secondary | ICD-10-CM | POA: Insufficient documentation

## 2020-08-12 DIAGNOSIS — A4151 Sepsis due to Escherichia coli [E. coli]: Secondary | ICD-10-CM | POA: Diagnosis not present

## 2020-08-12 DIAGNOSIS — R109 Unspecified abdominal pain: Secondary | ICD-10-CM | POA: Diagnosis not present

## 2020-08-12 DIAGNOSIS — N201 Calculus of ureter: Secondary | ICD-10-CM | POA: Insufficient documentation

## 2020-08-12 DIAGNOSIS — K219 Gastro-esophageal reflux disease without esophagitis: Secondary | ICD-10-CM | POA: Insufficient documentation

## 2020-08-12 LAB — URINALYSIS, ROUTINE W REFLEX MICROSCOPIC
Bilirubin Urine: NEGATIVE
Glucose, UA: NEGATIVE mg/dL
Hgb urine dipstick: NEGATIVE
Ketones, ur: NEGATIVE mg/dL
Nitrite: NEGATIVE
Protein, ur: NEGATIVE mg/dL
Specific Gravity, Urine: 1.015 (ref 1.005–1.030)
pH: 7 (ref 5.0–8.0)

## 2020-08-12 LAB — URINALYSIS, MICROSCOPIC (REFLEX)

## 2020-08-12 MED ORDER — KETOROLAC TROMETHAMINE 30 MG/ML IJ SOLN
30.0000 mg | Freq: Once | INTRAMUSCULAR | Status: AC
  Administered 2020-08-12: 30 mg via INTRAMUSCULAR
  Filled 2020-08-12: qty 1

## 2020-08-12 MED ORDER — ONDANSETRON 4 MG PO TBDP
4.0000 mg | ORAL_TABLET | Freq: Once | ORAL | Status: AC
  Administered 2020-08-12: 4 mg via ORAL
  Filled 2020-08-12: qty 1

## 2020-08-12 NOTE — ED Provider Notes (Signed)
Emergency Department Provider Note   I have reviewed the triage vital signs and the nursing notes.   HISTORY  Chief Complaint Flank Pain   HPI Candace Wright is a 60 y.o. female with past medical history reviewed below including prior kidney stone and UTI presents to the emergency department with left lower quadrant abdominal and flank pain.  Patient developed some mild urinary tract infection symptoms earlier in the week and today completed a 5-day course of Macrobid which she self prescribed at home.  She denies any fevers.  She developed pain in the left flank which reminded her of kidney stone pain she is had in the past.  She did have some nausea with vomiting today.  She spoke with her urologist to recommended Flomax and hydrocodone along with nausea medication and would get a CT scan this coming week but with worsening pain she presents to the ED for evaluation. No CP or SOB.    Past Medical History:  Diagnosis Date  . GERD (gastroesophageal reflux disease)   . Left ureteral stone   . Wears glasses     There are no problems to display for this patient.   Past Surgical History:  Procedure Laterality Date  . LAPAROSCOPIC TUBAL LIGATION  1994  . ORIF  LEFT ANKLE FX  2000  . REMOVAL HARDWARE LEFT ANKLE   2001    Allergies Ibuprofen  History reviewed. No pertinent family history.  Social History Social History   Tobacco Use  . Smoking status: Never Smoker  . Smokeless tobacco: Never Used  Substance Use Topics  . Alcohol use: No  . Drug use: No    Review of Systems  Constitutional: No fever/chills Eyes: No visual changes. ENT: No sore throat. Cardiovascular: Denies chest pain. Respiratory: Denies shortness of breath. Gastrointestinal: Positive LLQ abdominal pain. Positive nausea and vomiting.  No diarrhea.  No constipation. Genitourinary: Negative for dysuria. Mild urinary urgency.  Musculoskeletal: Negative for back pain. Skin: Negative for  rash. Neurological: Negative for headaches, focal weakness or numbness.  10-point ROS otherwise negative.  ____________________________________________   PHYSICAL EXAM:  VITAL SIGNS: ED Triage Vitals  Enc Vitals Group     BP 08/12/20 2113 (!) 145/78     Pulse Rate 08/12/20 2113 96     Resp 08/12/20 2113 18     Temp 08/12/20 2113 98.9 F (37.2 C)     Temp Source 08/12/20 2113 Oral     SpO2 08/12/20 2113 100 %     Weight 08/12/20 2110 260 lb (117.9 kg)   Constitutional: Alert and oriented. Well appearing and in no acute distress. Eyes: Conjunctivae are normal.  Head: Atraumatic. Nose: No congestion/rhinnorhea. Mouth/Throat: Mucous membranes are moist.   Neck: No stridor.   Cardiovascular: Normal rate, regular rhythm. Good peripheral circulation. Grossly normal heart sounds.   Respiratory: Normal respiratory effort.  No retractions. Lungs CTAB. Gastrointestinal: Soft and non-tender including in the LLQ. No distention.  Musculoskeletal: No gross deformities of extremities. Neurologic:  Normal speech and language.  Skin:  Skin is warm, dry and intact. No rash noted.   ____________________________________________   LABS (all labs ordered are listed, but only abnormal results are displayed)  Labs Reviewed  URINALYSIS, ROUTINE W REFLEX MICROSCOPIC - Abnormal; Notable for the following components:      Result Value   Color, Urine STRAW (*)    APPearance CLOUDY (*)    Leukocytes,Ua MODERATE (*)    All other components within normal limits  URINALYSIS,  MICROSCOPIC (REFLEX) - Abnormal; Notable for the following components:   Bacteria, UA FEW (*)    All other components within normal limits  URINE CULTURE   ____________________________________________  RADIOLOGY  CT Renal Stone Study  Result Date: 08/12/2020 CLINICAL DATA:  History of kidney stones with left-sided flank pain. EXAM: CT ABDOMEN AND PELVIS WITHOUT CONTRAST TECHNIQUE: Multidetector CT imaging of the abdomen  and pelvis was performed following the standard protocol without IV contrast. COMPARISON:  CT dated October 05, 2019 FINDINGS: Lower chest: There is atelectasis at the right lung base.The heart size is normal. Hepatobiliary: There is a pattern megaly with hepatic steatosis. The hepatic dome is not entirely visualized on this study. There are 2 small lesions in the liver that are stable from prior study and likely represent cysts. The gallbladder is unremarkable. Pancreas: Normal contours without ductal dilatation. No peripancreatic fluid collection. Spleen: Unremarkable. Adrenals/Urinary Tract: --Adrenal glands: Unremarkable. --Right kidney/ureter: No hydronephrosis or radiopaque kidney stones. --Left kidney/ureter: There is moderate left-sided hydronephrosis secondary to a stone measuring approximately 9 mm at the left UPJ. There is an additional nonobstructing stone in the lower pole measuring approximately 4 mm. --Urinary bladder: Unremarkable. Stomach/Bowel: --Stomach/Duodenum: No hiatal hernia or other gastric abnormality. Normal duodenal course and caliber. --Small bowel: Unremarkable. --Colon: Unremarkable. --Appendix: Normal. Vascular/Lymphatic: Normal course and caliber of the major abdominal vessels. --No retroperitoneal lymphadenopathy. --No mesenteric lymphadenopathy. --No pelvic or inguinal lymphadenopathy. Reproductive: Unremarkable Other: No ascites or free air. The abdominal wall is normal. Musculoskeletal. No acute displaced fractures. IMPRESSION: 1. Moderate left-sided hydronephrosis secondary to a 9 mm stone at the left UPJ. 2. Additional nonobstructing stone in the lower pole of the left kidney measuring approximately 4 mm. 3. Hepatomegaly with hepatic steatosis. Electronically Signed   By: Katherine Mantle M.D.   On: 08/12/2020 23:38    ____________________________________________   PROCEDURES  Procedure(s) performed:   Procedures  None   ____________________________________________   INITIAL IMPRESSION / ASSESSMENT AND PLAN / ED COURSE  Pertinent labs & imaging results that were available during my care of the patient were reviewed by me and considered in my medical decision making (see chart for details).   Patient presents the emergency department left lower abdominal pain radiating to the left flank reminding the patient of prior kidney stones.  She is well-appearing here.  UA is equivocal for UTI.  Few bacteria with leukocytes but no hemoglobin.  Nitrite negative.  Doubt pyelonephritis clinically.  No SIRS vitals. Plan for CT renal to evaluate for possible kidney stone.   CT renal showing 9 mm ureteral stone at the left UVJ correlating with the patient's symptoms.  There is some hydronephrosis.  UA shows few bacteria as described above.  No SIRS vitals to suspect developing sepsis. Patient's pain is well controlled here.  Patient has established care with urology already.  She plans to call on Monday to make them aware of her stone and establish a follow-up appointment within the next week.  Discussed strict ED return precautions with fever or uncontrolled pain/vomiting symptoms.  Discussed that ideally the patient would be seen at the [facility] emergency department where Urology procedures are performed with her group but if symptoms are severe she could call EMS or return here if no other option.    ____________________________________________  FINAL CLINICAL IMPRESSION(S) / ED DIAGNOSES  Final diagnoses:  Ureterolithiasis     MEDICATIONS GIVEN DURING THIS VISIT:  Medications  ketorolac (TORADOL) 30 MG/ML injection 30 mg (30 mg Intramuscular Given  08/12/20 2334)  ondansetron (ZOFRAN-ODT) disintegrating tablet 4 mg (4 mg Oral Given 08/12/20 2334)  cephALEXin (KEFLEX) capsule 500 mg (500 mg Oral Given 08/13/20 0008)     NEW OUTPATIENT MEDICATIONS STARTED DURING THIS VISIT:  Discharge Medication List as of  08/13/2020 12:05 AM    START taking these medications   Details  cephALEXin (KEFLEX) 500 MG capsule Take 1 capsule (500 mg total) by mouth 4 (four) times daily for 7 days., Starting Sun 08/13/2020, Until Sun 08/20/2020, Print        Note:  This document was prepared using Dragon voice recognition software and may include unintentional dictation errors.  Alona Bene, MD, [facility]    Darbi Chandran, Arlyss Repress, MD 08/13/20 [phone]

## 2020-08-12 NOTE — ED Triage Notes (Addendum)
Pt reports hx of kidney stones, states hx of same. Left sided pain at this time. Reports she spoke to her urologist yesterday and he started her on flomax and hydrocodone, nausea meds. some vomiting today.

## 2020-08-13 MED ORDER — CEPHALEXIN 250 MG PO CAPS
500.0000 mg | ORAL_CAPSULE | Freq: Once | ORAL | Status: AC
  Administered 2020-08-13: 500 mg via ORAL
  Filled 2020-08-13: qty 2

## 2020-08-13 MED ORDER — CEPHALEXIN 500 MG PO CAPS: 500.0000 mg | ORAL_CAPSULE | Freq: Four times a day (QID) | ORAL | 0 refills | Status: DC

## 2020-08-13 NOTE — Discharge Instructions (Signed)
You were seen in the emergency department today with a kidney stone on the left.  This is a relatively large stone that may ultimately require assistance with passage.  I would like you to call your urologist first thing on Monday morning to schedule a follow-up appointment.  Your urine does not show obvious infection but with few bacteria on the urine test today and you being on recent antibiotics I will start you on Keflex.   If you develop fever worsening pain he should return to the emergency department immediately.  You may take your home pain and nausea medication along with Flomax as prescribed.

## 2020-08-14 LAB — URINE CULTURE: Culture: NO GROWTH

## 2020-08-15 ENCOUNTER — Encounter (HOSPITAL_COMMUNITY): Payer: Self-pay

## 2020-08-15 ENCOUNTER — Inpatient Hospital Stay (HOSPITAL_COMMUNITY)
Admission: EM | Admit: 2020-08-15 | Discharge: 2020-08-18 | DRG: 853 | Disposition: A | Discharge status: Home Health | Payer: No Typology Code available for payment source | Attending: Family Medicine | Admitting: Family Medicine

## 2020-08-15 ENCOUNTER — Emergency Department (HOSPITAL_COMMUNITY): Payer: No Typology Code available for payment source

## 2020-08-15 ENCOUNTER — Inpatient Hospital Stay (HOSPITAL_COMMUNITY): Payer: No Typology Code available for payment source | Admitting: Certified Registered Nurse Anesthetist

## 2020-08-15 ENCOUNTER — Other Ambulatory Visit: Payer: Self-pay

## 2020-08-15 ENCOUNTER — Encounter (HOSPITAL_COMMUNITY)
Admission: EM | Disposition: A | Discharge status: Home / Self Care | Payer: Self-pay | Source: Home / Self Care | Attending: Family Medicine

## 2020-08-15 ENCOUNTER — Inpatient Hospital Stay (HOSPITAL_COMMUNITY): Payer: No Typology Code available for payment source

## 2020-08-15 DIAGNOSIS — R0902 Hypoxemia: Secondary | ICD-10-CM

## 2020-08-15 DIAGNOSIS — J9601 Acute respiratory failure with hypoxia: Secondary | ICD-10-CM | POA: Diagnosis present

## 2020-08-15 DIAGNOSIS — R7881 Bacteremia: Secondary | ICD-10-CM | POA: Diagnosis not present

## 2020-08-15 DIAGNOSIS — R197 Diarrhea, unspecified: Secondary | ICD-10-CM | POA: Diagnosis present

## 2020-08-15 DIAGNOSIS — R509 Fever, unspecified: Secondary | ICD-10-CM

## 2020-08-15 DIAGNOSIS — Z87440 Personal history of urinary (tract) infections: Secondary | ICD-10-CM | POA: Diagnosis not present

## 2020-08-15 DIAGNOSIS — K219 Gastro-esophageal reflux disease without esophagitis: Secondary | ICD-10-CM | POA: Diagnosis present

## 2020-08-15 DIAGNOSIS — A419 Sepsis, unspecified organism: Secondary | ICD-10-CM | POA: Diagnosis not present

## 2020-08-15 DIAGNOSIS — N201 Calculus of ureter: Secondary | ICD-10-CM | POA: Diagnosis not present

## 2020-08-15 DIAGNOSIS — R0602 Shortness of breath: Secondary | ICD-10-CM | POA: Diagnosis present

## 2020-08-15 DIAGNOSIS — Z87442 Personal history of urinary calculi: Secondary | ICD-10-CM | POA: Diagnosis not present

## 2020-08-15 DIAGNOSIS — Z8781 Personal history of (healed) traumatic fracture: Secondary | ICD-10-CM | POA: Diagnosis not present

## 2020-08-15 DIAGNOSIS — N179 Acute kidney failure, unspecified: Secondary | ICD-10-CM | POA: Insufficient documentation

## 2020-08-15 DIAGNOSIS — Z20822 Contact with and (suspected) exposure to covid-19: Secondary | ICD-10-CM | POA: Diagnosis present

## 2020-08-15 DIAGNOSIS — R7989 Other specified abnormal findings of blood chemistry: Secondary | ICD-10-CM | POA: Diagnosis present

## 2020-08-15 DIAGNOSIS — Z6841 Body Mass Index (BMI) 40.0 and over, adult: Secondary | ICD-10-CM | POA: Diagnosis not present

## 2020-08-15 DIAGNOSIS — K5900 Constipation, unspecified: Secondary | ICD-10-CM | POA: Diagnosis present

## 2020-08-15 DIAGNOSIS — Z886 Allergy status to analgesic agent status: Secondary | ICD-10-CM

## 2020-08-15 DIAGNOSIS — Z79899 Other long term (current) drug therapy: Secondary | ICD-10-CM | POA: Diagnosis not present

## 2020-08-15 DIAGNOSIS — Z1612 Extended spectrum beta lactamase (ESBL) resistance: Secondary | ICD-10-CM

## 2020-08-15 DIAGNOSIS — N136 Pyonephrosis: Secondary | ICD-10-CM | POA: Diagnosis present

## 2020-08-15 DIAGNOSIS — A499 Bacterial infection, unspecified: Secondary | ICD-10-CM

## 2020-08-15 DIAGNOSIS — B9620 Unspecified Escherichia coli [E. coli] as the cause of diseases classified elsewhere: Principal | ICD-10-CM

## 2020-08-15 DIAGNOSIS — R109 Unspecified abdominal pain: Secondary | ICD-10-CM | POA: Diagnosis present

## 2020-08-15 DIAGNOSIS — A4151 Sepsis due to Escherichia coli [E. coli]: Secondary | ICD-10-CM | POA: Diagnosis present

## 2020-08-15 HISTORY — PX: CYSTOSCOPY W/ URETERAL STENT PLACEMENT: SHX1429

## 2020-08-15 LAB — I-STAT CHEM 8, ED
BUN: 19 mg/dL (ref 6–20)
Calcium, Ion: 1.36 mmol/L (ref 1.15–1.40)
Chloride: 103 mmol/L (ref 98–111)
Creatinine, Ser: 1.6 mg/dL — ABNORMAL HIGH (ref 0.44–1.00)
Glucose, Bld: 110 mg/dL — ABNORMAL HIGH (ref 70–99)
HCT: 42 % (ref 36.0–46.0)
Hemoglobin: 14.3 g/dL (ref 12.0–15.0)
Potassium: 4 mmol/L (ref 3.5–5.1)
Sodium: 137 mmol/L (ref 135–145)
TCO2: 24 mmol/L (ref 22–32)

## 2020-08-15 LAB — COMPREHENSIVE METABOLIC PANEL
ALT: 23 U/L (ref 0–44)
ALT: 25 U/L (ref 0–44)
AST: 16 U/L (ref 15–41)
AST: 26 U/L (ref 15–41)
Albumin: 3.8 g/dL (ref 3.5–5.0)
Albumin: 4.3 g/dL (ref 3.5–5.0)
Alkaline Phosphatase: 93 U/L (ref 38–126)
Alkaline Phosphatase: 108 U/L (ref 38–126)
Anion gap: 12 (ref 5–15)
Anion gap: 14 (ref 5–15)
BUN: 20 mg/dL (ref 6–20)
BUN: 18 mg/dL (ref 6–20)
CO2: 23 mmol/L (ref 22–32)
CO2: 22 mmol/L (ref 22–32)
Calcium: 10.2 mg/dL (ref 8.9–10.3)
Calcium: 10.9 mg/dL — ABNORMAL HIGH (ref 8.9–10.3)
Chloride: 101 mmol/L (ref 98–111)
Chloride: 103 mmol/L (ref 98–111)
Creatinine, Ser: 1.62 mg/dL — ABNORMAL HIGH (ref 0.44–1.00)
Creatinine, Ser: 1.82 mg/dL — ABNORMAL HIGH (ref 0.44–1.00)
GFR, Estimated: 37 mL/min — ABNORMAL LOW (ref >60)
GFR, Estimated: 32 mL/min — ABNORMAL LOW (ref >60)
Glucose, Bld: 118 mg/dL — ABNORMAL HIGH (ref 70–99)
Glucose, Bld: 132 mg/dL — ABNORMAL HIGH (ref 70–99)
Potassium: 3.9 mmol/L (ref 3.5–5.1)
Potassium: 4.7 mmol/L (ref 3.5–5.1)
Sodium: 136 mmol/L (ref 135–145)
Sodium: 139 mmol/L (ref 135–145)
Total Bilirubin: 2.6 mg/dL — ABNORMAL HIGH (ref 0.3–1.2)
Total Bilirubin: 2.8 mg/dL — ABNORMAL HIGH (ref 0.3–1.2)
Total Protein: 6.5 g/dL (ref 6.5–8.1)
Total Protein: 7.8 g/dL (ref 6.5–8.1)

## 2020-08-15 LAB — CBC WITH DIFFERENTIAL/PLATELET
Abs Immature Granulocytes: 0.06 10*3/uL (ref 0.00–0.07)
Abs Immature Granulocytes: 0.02 10*3/uL (ref 0.00–0.07)
Basophils Absolute: 0 10*3/uL (ref 0.0–0.1)
Basophils Absolute: 0 10*3/uL (ref 0.0–0.1)
Basophils Relative: 0 %
Basophils Relative: 0 %
Eosinophils Absolute: 0.1 10*3/uL (ref 0.0–0.5)
Eosinophils Absolute: 0 10*3/uL (ref 0.0–0.5)
Eosinophils Relative: 1 %
Eosinophils Relative: 0 %
HCT: 44.7 % (ref 36.0–46.0)
HCT: 50.6 % — ABNORMAL HIGH (ref 36.0–46.0)
Hemoglobin: 14.3 g/dL (ref 12.0–15.0)
Hemoglobin: 16 g/dL — ABNORMAL HIGH (ref 12.0–15.0)
Immature Granulocytes: 1 %
Immature Granulocytes: 1 %
Lymphocytes Relative: 4 %
Lymphocytes Relative: 11 %
Lymphs Abs: 0.4 10*3/uL — ABNORMAL LOW (ref 0.7–4.0)
Lymphs Abs: 0.3 10*3/uL — ABNORMAL LOW (ref 0.7–4.0)
MCH: 28.8 pg (ref 26.0–34.0)
MCH: 28.4 pg (ref 26.0–34.0)
MCHC: 32 g/dL (ref 30.0–36.0)
MCHC: 31.6 g/dL (ref 30.0–36.0)
MCV: 89.9 fL (ref 80.0–100.0)
MCV: 89.9 fL (ref 80.0–100.0)
Monocytes Absolute: 0.2 10*3/uL (ref 0.1–1.0)
Monocytes Absolute: 0.1 10*3/uL (ref 0.1–1.0)
Monocytes Relative: 2 %
Monocytes Relative: 2 %
Neutro Abs: 9.3 10*3/uL — ABNORMAL HIGH (ref 1.7–7.7)
Neutro Abs: 2.6 10*3/uL (ref 1.7–7.7)
Neutrophils Relative %: 92 %
Neutrophils Relative %: 86 %
Platelets: 204 10*3/uL (ref 150–400)
Platelets: 192 10*3/uL (ref 150–400)
RBC: 4.97 MIL/uL (ref 3.87–5.11)
RBC: 5.63 MIL/uL — ABNORMAL HIGH (ref 3.87–5.11)
RDW: 13.8 % (ref 11.5–15.5)
RDW: 14 % (ref 11.5–15.5)
WBC: 10 10*3/uL (ref 4.0–10.5)
WBC: 3 10*3/uL — ABNORMAL LOW (ref 4.0–10.5)
nRBC: 0 % (ref 0.0–0.2)
nRBC: 0 % (ref 0.0–0.2)

## 2020-08-15 LAB — RESP PANEL BY RT-PCR (FLU A&B, COVID) ARPGX2
Influenza A by PCR: NEGATIVE
Influenza B by PCR: NEGATIVE
SARS Coronavirus 2 by RT PCR: NEGATIVE

## 2020-08-15 LAB — BILIRUBIN, FRACTIONATED(TOT/DIR/INDIR)
Bilirubin, Direct: 0.8 mg/dL — ABNORMAL HIGH (ref 0.0–0.2)
Indirect Bilirubin: 1.7 mg/dL — ABNORMAL HIGH (ref 0.3–0.9)
Total Bilirubin: 2.5 mg/dL — ABNORMAL HIGH (ref 0.3–1.2)

## 2020-08-15 LAB — URINALYSIS, ROUTINE W REFLEX MICROSCOPIC
Bacteria, UA: NONE SEEN
Bilirubin Urine: NEGATIVE
Glucose, UA: NEGATIVE mg/dL
Ketones, ur: NEGATIVE mg/dL
Leukocytes,Ua: NEGATIVE
Nitrite: NEGATIVE
Protein, ur: NEGATIVE mg/dL
Specific Gravity, Urine: 1.006 (ref 1.005–1.030)
pH: 6 (ref 5.0–8.0)

## 2020-08-15 LAB — HIV ANTIBODY (ROUTINE TESTING W REFLEX): HIV Screen 4th Generation wRfx: NONREACTIVE

## 2020-08-15 LAB — GLUCOSE, CAPILLARY: Glucose-Capillary: 148 mg/dL — ABNORMAL HIGH (ref 70–99)

## 2020-08-15 LAB — PROTIME-INR
INR: 1.2 (ref 0.8–1.2)
INR: 1.1 (ref 0.8–1.2)
Prothrombin Time: 14.3 seconds (ref 11.4–15.2)
Prothrombin Time: 13.8 seconds (ref 11.4–15.2)

## 2020-08-15 LAB — LACTIC ACID, PLASMA: Lactic Acid, Venous: 1.8 mmol/L (ref 0.5–1.9)

## 2020-08-15 LAB — APTT
aPTT: 22 seconds — ABNORMAL LOW (ref 24–36)
aPTT: 26 seconds (ref 24–36)

## 2020-08-15 SURGERY — CYSTOSCOPY, WITH RETROGRADE PYELOGRAM AND URETERAL STENT INSERTION
Anesthesia: General | Site: Bladder | Laterality: Left

## 2020-08-15 MED ORDER — DEXAMETHASONE SODIUM PHOSPHATE 10 MG/ML IJ SOLN
INTRAMUSCULAR | Status: DC | PRN
  Administered 2020-08-15: 10 mg via INTRAVENOUS

## 2020-08-15 MED ORDER — SODIUM CHLORIDE 0.9 % IV SOLN
1.0000 g | Freq: Once | INTRAVENOUS | Status: AC
  Administered 2020-08-15: 1 g via INTRAVENOUS
  Filled 2020-08-15: qty 10

## 2020-08-15 MED ORDER — ACETAMINOPHEN 325 MG PO TABS
650.0000 mg | ORAL_TABLET | Freq: Once | ORAL | Status: AC
  Administered 2020-08-15: 650 mg via ORAL
  Filled 2020-08-15: qty 2

## 2020-08-15 MED ORDER — LACTATED RINGERS IV BOLUS (SEPSIS)
1000.0000 mL | Freq: Once | INTRAVENOUS | Status: AC
  Administered 2020-08-15: 1000 mL via INTRAVENOUS

## 2020-08-15 MED ORDER — HEPARIN SODIUM (PORCINE) 5000 UNIT/ML IJ SOLN
5000.0000 [IU] | Freq: Three times a day (TID) | INTRAMUSCULAR | Status: DC
  Administered 2020-08-15 – 2020-08-18 (×10): 5000 [IU] via SUBCUTANEOUS
  Filled 2020-08-15 (×10): qty 1

## 2020-08-15 MED ORDER — PHENYLEPHRINE 40 MCG/ML (10ML) SYRINGE FOR IV PUSH (FOR BLOOD PRESSURE SUPPORT)
PREFILLED_SYRINGE | INTRAVENOUS | Status: DC | PRN
  Administered 2020-08-15: 80 ug via INTRAVENOUS
  Administered 2020-08-15 (×3): 120 ug via INTRAVENOUS
  Administered 2020-08-15: 80 ug via INTRAVENOUS
  Administered 2020-08-15: 120 ug via INTRAVENOUS

## 2020-08-15 MED ORDER — MIDAZOLAM HCL 5 MG/5ML IJ SOLN
INTRAMUSCULAR | Status: DC | PRN
  Administered 2020-08-15: 2 mg via INTRAVENOUS

## 2020-08-15 MED ORDER — LIDOCAINE HCL (PF) 2 % IJ SOLN
INTRAMUSCULAR | Status: AC
  Filled 2020-08-15: qty 5

## 2020-08-15 MED ORDER — AMISULPRIDE (ANTIEMETIC) 5 MG/2ML IV SOLN: 10.0000 mg | Freq: Once | INTRAVENOUS | Status: DC | PRN

## 2020-08-15 MED ORDER — ACETAMINOPHEN 160 MG/5ML PO SOLN: 325.0000 mg | Freq: Once | ORAL | Status: DC | PRN

## 2020-08-15 MED ORDER — LACTATED RINGERS IV SOLN: INTRAVENOUS | Status: DC

## 2020-08-15 MED ORDER — STERILE WATER FOR IRRIGATION IR SOLN
Status: DC | PRN
  Administered 2020-08-15: 3000 mL

## 2020-08-15 MED ORDER — FENTANYL CITRATE (PF) 100 MCG/2ML IJ SOLN
INTRAMUSCULAR | Status: AC
  Filled 2020-08-15: qty 2

## 2020-08-15 MED ORDER — IOHEXOL 350 MG/ML SOLN
100.0000 mL | Freq: Once | INTRAVENOUS | Status: AC | PRN
  Administered 2020-08-15: 80 mL via INTRAVENOUS

## 2020-08-15 MED ORDER — ONDANSETRON HCL 4 MG PO TABS: 4.0000 mg | ORAL_TABLET | Freq: Four times a day (QID) | ORAL | Status: DC | PRN

## 2020-08-15 MED ORDER — PHENYLEPHRINE 40 MCG/ML (10ML) SYRINGE FOR IV PUSH (FOR BLOOD PRESSURE SUPPORT)
PREFILLED_SYRINGE | INTRAVENOUS | Status: AC
  Filled 2020-08-15: qty 30

## 2020-08-15 MED ORDER — PROPOFOL 10 MG/ML IV BOLUS
INTRAVENOUS | Status: DC | PRN
Start: 2020-08-15 — End: 2020-08-15
  Administered 2020-08-15: 150 mg via INTRAVENOUS

## 2020-08-15 MED ORDER — ONDANSETRON HCL 4 MG/2ML IJ SOLN: 4.0000 mg | Freq: Four times a day (QID) | INTRAMUSCULAR | Status: DC | PRN

## 2020-08-15 MED ORDER — FENTANYL CITRATE (PF) 100 MCG/2ML IJ SOLN
INTRAMUSCULAR | Status: DC | PRN
  Administered 2020-08-15: 50 ug via INTRAVENOUS

## 2020-08-15 MED ORDER — HYDROCODONE-ACETAMINOPHEN 5-325 MG PO TABS
1.0000 | ORAL_TABLET | ORAL | Status: DC | PRN
Start: 2020-08-15 — End: 2020-08-18

## 2020-08-15 MED ORDER — CHLORHEXIDINE GLUCONATE 0.12 % MT SOLN
15.0000 mL | Freq: Once | OROMUCOSAL | Status: AC
  Administered 2020-08-15: 15 mL via OROMUCOSAL

## 2020-08-15 MED ORDER — SODIUM CHLORIDE 0.9 % IV BOLUS (SEPSIS)
1000.0000 mL | Freq: Once | INTRAVENOUS | Status: AC
  Administered 2020-08-15: 1000 mL via INTRAVENOUS

## 2020-08-15 MED ORDER — ACETAMINOPHEN 10 MG/ML IV SOLN: 1000.0000 mg | Freq: Once | INTRAVENOUS | Status: DC | PRN

## 2020-08-15 MED ORDER — ACETAMINOPHEN 325 MG PO TABS
650.0000 mg | ORAL_TABLET | Freq: Four times a day (QID) | ORAL | Status: DC | PRN
  Administered 2020-08-15: 650 mg via ORAL
  Filled 2020-08-15: qty 2

## 2020-08-15 MED ORDER — ONDANSETRON HCL 4 MG/2ML IJ SOLN
INTRAMUSCULAR | Status: DC | PRN
  Administered 2020-08-15: 4 mg via INTRAVENOUS

## 2020-08-15 MED ORDER — MORPHINE SULFATE (PF) 2 MG/ML IV SOLN: 2.0000 mg | INTRAVENOUS | Status: DC | PRN

## 2020-08-15 MED ORDER — ONDANSETRON HCL 4 MG/2ML IJ SOLN
INTRAMUSCULAR | Status: AC
  Filled 2020-08-15: qty 2

## 2020-08-15 MED ORDER — SODIUM CHLORIDE 0.9 % IV SOLN: INTRAVENOUS | Status: AC

## 2020-08-15 MED ORDER — ALBUTEROL SULFATE HFA 108 (90 BASE) MCG/ACT IN AERS
2.0000 | INHALATION_SPRAY | Freq: Once | RESPIRATORY_TRACT | Status: AC
  Administered 2020-08-15: 2 via RESPIRATORY_TRACT
  Filled 2020-08-15: qty 6.7

## 2020-08-15 MED ORDER — FENTANYL CITRATE (PF) 100 MCG/2ML IJ SOLN: 25.0000 ug | INTRAMUSCULAR | Status: DC | PRN

## 2020-08-15 MED ORDER — PROPOFOL 10 MG/ML IV BOLUS
INTRAVENOUS | Status: AC
  Filled 2020-08-15: qty 20

## 2020-08-15 MED ORDER — METOPROLOL TARTRATE 5 MG/5ML IV SOLN: 5.0000 mg | Freq: Four times a day (QID) | INTRAVENOUS | Status: DC | PRN

## 2020-08-15 MED ORDER — MIDAZOLAM HCL 2 MG/2ML IJ SOLN
INTRAMUSCULAR | Status: AC
  Filled 2020-08-15: qty 2

## 2020-08-15 MED ORDER — SODIUM CHLORIDE 0.9 % IV SOLN
1.0000 g | INTRAVENOUS | Status: DC
  Administered 2020-08-16: 1 g via INTRAVENOUS
  Filled 2020-08-15: qty 1

## 2020-08-15 MED ORDER — DEXAMETHASONE SODIUM PHOSPHATE 10 MG/ML IJ SOLN
INTRAMUSCULAR | Status: AC
  Filled 2020-08-15: qty 1

## 2020-08-15 MED ORDER — ACETAMINOPHEN 325 MG PO TABS: 325.0000 mg | ORAL_TABLET | Freq: Once | ORAL | Status: DC | PRN

## 2020-08-15 MED ORDER — IOHEXOL 300 MG/ML  SOLN
INTRAMUSCULAR | Status: DC | PRN
  Administered 2020-08-15: 2 mL

## 2020-08-15 MED ORDER — LIDOCAINE 2% (20 MG/ML) 5 ML SYRINGE
INTRAMUSCULAR | Status: DC | PRN
  Administered 2020-08-15: 40 mg via INTRAVENOUS

## 2020-08-15 MED ORDER — MEPERIDINE HCL 50 MG/ML IJ SOLN: 6.2500 mg | INTRAMUSCULAR | Status: DC | PRN

## 2020-08-15 MED ORDER — ACETAMINOPHEN 650 MG RE SUPP: 650.0000 mg | Freq: Four times a day (QID) | RECTAL | Status: DC | PRN

## 2020-08-15 SURGICAL SUPPLY — 13 items
BAG URO CATCHER STRL LF (MISCELLANEOUS) ×2 IMPLANT
CATH INTERMIT  6FR 70CM (CATHETERS) ×2 IMPLANT
CLOTH BEACON ORANGE TIMEOUT ST (SAFETY) ×2 IMPLANT
GLOVE SURG ENC MOIS LTX SZ7.5 (GLOVE) ×2 IMPLANT
GOWN STRL REUS W/TWL LRG LVL3 (GOWN DISPOSABLE) ×4 IMPLANT
GOWN STRL REUS W/TWL XL LVL3 (GOWN DISPOSABLE) ×2 IMPLANT
GUIDEWIRE STR DUAL SENSOR (WIRE) ×2 IMPLANT
KIT TURNOVER KIT A (KITS) ×2 IMPLANT
MANIFOLD NEPTUNE II (INSTRUMENTS) ×2 IMPLANT
PACK CYSTO (CUSTOM PROCEDURE TRAY) ×2 IMPLANT
STENT URET 6FRX24 CONTOUR (STENTS) ×1 IMPLANT
TUBING CONNECTING 10 (TUBING) ×2 IMPLANT
TUBING UROLOGY SET (TUBING) IMPLANT

## 2020-08-15 NOTE — ED Notes (Signed)
Pt ambulated to restroom with no oxygen. Oxygen saturation decreased to 87%.

## 2020-08-15 NOTE — H&P (Addendum)
History and Physical        Hospital Admission Note Date: 08/15/2020  Patient name: Candace Wright Medical record number: [id number] Date of birth: 11-Aug-1960 Age: 60 y.o. Gender: female  PCP: Maurice Small, MD    Chief Complaint    Chief Complaint  Patient presents with  . Flank Pain      HPI:   This is a 60 year old female with a history of nephrolithiasis and recently diagnosed 9 mm left UPJ stone  with moderate left sided hydronephrosis on 08/12/2020 who presented to the ED with acute onset chills and tachypnea/shortness of breath which woke her up last night prompting her to come to the ED. Patient states that she has been dealing with UTIs for the past year and has previously been diagnosed with nephrolithiasis but has not had any interventions as these were small stones. States that Friday night she had left sided pain which prompted her to go to the ED on Saturday and was diagnosed with a 9 mm left UPJ stone with moderate left-sided hydro. [facility] was seen in [facility] yesterday (Monday) and plans to schedule a lithotripsy, however this was not scheduled yet. Last night she had acute onset symptoms as above prompting her to come to the ED. Has been sitting around most of the day for the past several days. Shortness of breath improved with O2 by EMS. No history of tobacco use, asthma or COPD or DVT/PE.   ED Course: Febrile, tachycardic, tachypneic,  hemodynamically stable, SpO2 dropped to 87% with ambulation on room air and was placed on 2 to 3 L/min O2. Notable Labs: Sodium 137, K4.0, BUN 19, creatinine 1.6, lactic acid 1.8, WBC 10.0, Hb 14.3, platelets 204, UA unremarkable, COVID-19 and flu negative. Notable Imaging: CXR-low lung volumes with mild bibasilar atelectasis. CTA chest-low lung volumes with no acute intrathoracic abnormality. Patient received  ceftriaxone, 1 L NS bolus, albuterol and Tylenol.    Vitals:   08/15/20 0700 08/15/20 0734  BP: 99/60 (!) 144/84  Pulse: 99 (!) 102  Resp: 13 (!) 22  Temp:  98.2 F (36.8 C)  SpO2: 91% 97%     Review of Systems:  Review of Systems  Respiratory: Negative for sputum production and wheezing.   Cardiovascular: Negative for chest pain, orthopnea and leg swelling.  Genitourinary: Negative for dysuria, flank pain and hematuria.  All other systems reviewed and are negative.   Medical/Social/Family History   Past Medical History: Past Medical History:  Diagnosis Date  . GERD (gastroesophageal reflux disease)   . Left ureteral stone   . Wears glasses     Past Surgical History:  Procedure Laterality Date  . LAPAROSCOPIC TUBAL LIGATION  1994  . ORIF  LEFT ANKLE FX  2000  . REMOVAL HARDWARE LEFT ANKLE   2001    Medications: Prior to Admission medications   Medication Sig Start Date End Date Taking? Authorizing Provider  cephALEXin (KEFLEX) 500 MG capsule Take 1 capsule (500 mg total) by mouth 4 (four) times daily for 7 days. 08/13/20 08/20/20 Yes Long, Arlyss Repress, MD  HYDROcodone-acetaminophen (NORCO) 5-325 MG tablet Take 1 tablet by mouth every 6 (six) hours as needed for moderate pain.  01/09/16  Yes Alvira Monday, MD  ondansetron (ZOFRAN) 4 MG tablet Take 4 mg by mouth every 8 (eight) hours as needed for nausea or vomiting. 08/11/20  Yes [provider]  tamsulosin (FLOMAX) 0.4 MG CAPS capsule Take 1 capsule (0.4 mg total) by mouth daily. Patient taking differently: Take 0.4 mg by mouth daily after breakfast. 01/09/16  Yes Alvira Monday, MD    Allergies:   Allergies  Allergen Reactions  . Ibuprofen Other (See Comments)    "Funny feeling in my head"    Social History:  reports that she has never smoked. She has never used smokeless tobacco. She reports that she does not drink alcohol and does not use drugs.  Family History: No family history on  file.   Objective   Physical Exam: Blood pressure (!) 144/84, pulse (!) 102, temperature 98.2 F (36.8 C), temperature source Oral, resp. rate (!) 22, SpO2 97 %.  Physical Exam Vitals and nursing note reviewed.  Constitutional:      General: She is not in acute distress.    Appearance: Normal appearance.  HENT:     Head: Normocephalic and atraumatic.  Eyes:     Conjunctiva/sclera: Conjunctivae normal.  Cardiovascular:     Rate and Rhythm: Regular rhythm. Tachycardia present.  Pulmonary:     Effort: Pulmonary effort is normal.     Breath sounds: Rales present.     Comments: SpO2 dropped from 96% to 90% on room air, placed on 1 L/min with improvement to 94% Abdominal:     General: Abdomen is flat.     Palpations: Abdomen is soft.     Tenderness: There is no abdominal tenderness.  Musculoskeletal:        General: No swelling or tenderness.  Skin:    Coloration: Skin is not jaundiced or pale.  Neurological:     Mental Status: She is alert. Mental status is at baseline.  Psychiatric:        Mood and Affect: Mood normal.        Behavior: Behavior normal.     LABS on Admission: I have personally reviewed all the labs and imaging below    Basic Metabolic Panel: Recent Labs  Lab 08/15/20 0135 08/15/20 0210  NA 136 137  K 3.9 4.0  CL 101 103  CO2 23  --   GLUCOSE 118* 110*  BUN 20 19  CREATININE 1.62* 1.60*  CALCIUM 10.2  --    Liver Function Tests: Recent Labs  Lab 08/15/20 0135  AST 16  ALT 23  ALKPHOS 93  BILITOT 2.6*  PROT 6.5  ALBUMIN 3.8   No results for input(s): LIPASE, AMYLASE in the last 168 hours. No results for input(s): AMMONIA in the last 168 hours. CBC: Recent Labs  Lab 08/15/20 0135 08/15/20 0210  WBC 10.0  --   NEUTROABS 9.3*  --   HGB 14.3 14.3  HCT 44.7 42.0  MCV 89.9  --   PLT 204  --    Cardiac Enzymes: No results for input(s): CKTOTAL, CKMB, CKMBINDEX, TROPONINI in the last 168 hours. BNP: Invalid input(s):  POCBNP CBG: No results for input(s): GLUCAP in the last 168 hours.  Radiological Exams on Admission:  CT Angio Chest PE W and/or Wo Contrast  Result Date: 08/15/2020 CLINICAL DATA:  Left flank pain, shortness of breath, white blood cell 10, negative COVID. Pulmonary embolus suspected. Recent diagnosis of renal stone on 08/12/2020. EXAM: CT ANGIOGRAPHY CHEST WITH CONTRAST TECHNIQUE: Multidetector CT imaging of  the chest was performed using the standard protocol during bolus administration of intravenous contrast. Multiplanar CT image reconstructions and MIPs were obtained to evaluate the vascular anatomy. CONTRAST:  39mL OMNIPAQUE IOHEXOL 350 MG/ML SOLN COMPARISON:  CT renal 08/12/2020, chest x-ray 08/15/2020 FINDINGS: Cardiovascular: Satisfactory opacification of the pulmonary arteries to the segmental level. No evidence of pulmonary embolism. The main pulmonary artery is enlarged in caliber. Normal heart size. No significant pericardial effusion. The thoracic aorta is normal in caliber. No atherosclerotic plaque of the thoracic aorta. No coronary artery calcifications. Mediastinum/Nodes: Enlarged right paratracheal lymph node measuring up to 1.6 cm. Borderline enlarged prevascular lymph node measuring 0.9 cm. Prominent but nonenlarged hilar lymph nodes. No axillary lymph nodes. Thyroid gland, trachea, and esophagus demonstrate no significant findings. Lungs/Pleura: Low lung volumes. Bilateral lower lobe subsegmental atelectasis. No focal consolidation. No pulmonary nodule. No pulmonary mass. No pleural effusion. No pneumothorax. Upper Abdomen: No acute abnormality. Musculoskeletal: No chest wall abnormality No suspicious lytic or blastic osseous lesions. No acute displaced fracture. Multilevel mild degenerative changes of the spine. Review of the MIP images confirms the above findings. IMPRESSION: 1. No central or segmental pulmonary embolus. Limited evaluation more distally due to artifact. 2. Low lung  volumes with no acute intrathoracic abnormality. Electronically Signed   By: Tish Frederickson M.D.   On: 08/15/2020 05:21   DG Chest Port 1 View  Result Date: 08/15/2020 CLINICAL DATA:  Shortness of breath. EXAM: PORTABLE CHEST 1 VIEW COMPARISON:  Lung bases from abdominal CT 3 days ago 08/12/2020 FINDINGS: Lung volumes are low. Mild elevation of right hemidiaphragm. Mild bibasilar atelectasis. No confluent consolidation. Normal heart size. No pleural fluid or pneumothorax. No acute osseous abnormalities are seen. IMPRESSION: Low lung volumes with mild bibasilar atelectasis. Electronically Signed   By: Narda Rutherford M.D.   On: 08/15/2020 02:05      EKG: sinus tachycardia   A & P   Principal Problem:   Acute hypoxemic respiratory failure (HCC) Active Problems:   Shortness of breath   Sepsis (HCC)   Obstruction of left ureteropelvic junction (UPJ) due to stone   Elevated serum creatinine   1. Sepsis without septic shock suspect secondary to obstructing left UPJ stone with hydronephrosis a. Sepsis criteria: Fever, tachycardia, tachypnea b. Continue IV fluid boluses per sepsis protocol c. UA negative, though infection is likely obstructed by stone d. Follow-up blood cultures e. Continue ceftriaxone f. Urology consulted, discussed with Dr. Alvester Morin. Plan for stent today. Will keep n.p.o.  2. Acute hypoxic respiratory failure, suspect secondary to atelectasis a. SpO2 intermittently drops to high 80s low 90s on room air b. Atelectasis on CXR and rales on exam c. Incentive spirometry  3. Elevated creatinine a. Creatinine 1.6, baseline 0.8 on June 2021  4. Elevated T billi a. AST/ALT unremarkable b. Check fractionated bilirubin  5. Abnormal CT chest a. Incidental enlarged right paratracheal lymph node measuring 1.6 cm noted - Discussed with Dr. 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   DVT prophylaxis: heparin   Code Status: Full Code   Diet: NPO Family Communication: Admission, patients condition and plan of care including tests being ordered have been discussed with the patient who indicates understanding and agrees with the plan and Code Status. Patient's husband was updated  Disposition Plan: Hopefully once she has her urologic procedure today and is observed overnight she will be improved by tomorrow.   The appropriate patient status for this patient is OBSERVATION. Observation  status is judged to be reasonable and necessary in order to provide the required intensity of service to ensure the patient's safety. The patient's presenting symptoms, physical exam findings, and initial radiographic and laboratory data in the context of their medical condition is felt to place them at decreased risk for further clinical deterioration. Furthermore, it is anticipated that the patient will be medically stable for discharge from the hospital within 2 midnights of admission. The following factors support the patient status of observation.   " The patient's presenting symptoms include chills, shortness of breath. " The physical exam findings include tachycardia. " The initial radiographic and laboratory data are fever, tachycardia, tachypnea.   Consultants  . Urology  Procedures  . none  Time Spent on Admission: 66 minutes    Jae DireJared E Aashka Salomone, DO [facility]  08/15/2020, 8:34 AM

## 2020-08-15 NOTE — Transfer of Care (Signed)
Immediate Anesthesia Transfer of Care Note  Patient: Candace Wright  Procedure(s) Performed: CYSTOSCOPY WITH RETROGRADE PYELOGRAM/URETERAL STENT PLACEMENT (Left Bladder)  Patient Location: PACU  Anesthesia Type:General  Level of Consciousness: awake, alert , oriented and patient cooperative  Airway & Oxygen Therapy: Patient Spontanous Breathing and Patient connected to face mask oxygen  Post-op Assessment: Report given to RN, Post -op Vital signs reviewed and stable and Patient moving all extremities  Post vital signs: Reviewed and stable  Last Vitals:  Vitals Value Taken Time  BP 100/55 08/15/20 1341  Temp    Pulse 103 08/15/20 1342  Resp 16 08/15/20 1342  SpO2 99 % 08/15/20 1342  Vitals shown include unvalidated device data.  Last Pain:  Vitals:   08/15/20 0934  TempSrc:   PainSc: 1       Patients Stated Pain Goal: 0 (08/15/20 0121)  Complications: No complications documented.

## 2020-08-15 NOTE — Consult Note (Signed)
Urology Consult   Physician requesting consult: Whitney Post  Reason for consult: Nephrolihiasis  History of Present Illness: Candace Wright is a 60 y.o. female with PMH significant for UTIs, GERD, and nephrolithiasis who presented to the ED last night with c/o SOB and chills.  Her SOB improved with O2 via [place] by EMS and chills subsided in the ED.  Temp in ED was 102, she was tachypneic and tachycardic all of which have improved. CXR shows low lung volumes with bibasilar ATX and CTA chest shows no acute intrathoracic abnormality. Cr 1.6, WBC 10. UA negative (on ABx). She was started on IVF, IV Rocephin, albuterol, and tylenol.   She was evaled in the ED 3 days ago for left flank pain and work up revealed 9mm left UPJ stone with moderate hydro.  She was d/c'd home with pain meds, a 7 day course of Keflex, and instructed to f/u with [facility] which she did on Monday.  She was asymptomatic at the time and was scheduled to have a lithotripsy in the near future. Pt has a hx of UTIs for which she has been evaled at [facility] u[facility].  Prior to her presentation to the ED on Sat she developed mild UTI sx and self treated with a 5 day course of macrobid. Urine culture collected in ED was negative. She developed constipation after starting pain meds over the weekend and started [place].  She then developed diarrhea which has since resolved.  She is currently resting comfortably in the ED.  She denies HA, F/C, CP, SOB, N/V, diarrhea/constipation, and flank pain.  She is currently AF but still tachycardic. BP stable.   Past Medical History:  Diagnosis Date  . GERD (gastroesophageal reflux disease)   . Left ureteral stone   . Wears glasses     Past Surgical History:  Procedure Laterality Date  . LAPAROSCOPIC TUBAL LIGATION  1994  . ORIF  LEFT ANKLE FX  2000  . REMOVAL HARDWARE LEFT ANKLE   2001     Current Hospital Medications:  Home meds:  . Current Meds  Medication Sig  . cephALEXin  (KEFLEX) 500 MG capsule Take 1 capsule (500 mg total) by mouth 4 (four) times daily for 7 days.  Marland Kitchen HYDROcodone-acetaminophen (NORCO) 5-325 MG tablet Take 1 tablet by mouth every 6 (six) hours as needed for moderate pain.  Marland Kitchen ondansetron (ZOFRAN) 4 MG tablet Take 4 mg by mouth every 8 (eight) hours as needed for nausea or vomiting.  . tamsulosin (FLOMAX) 0.4 MG CAPS capsule Take 1 capsule (0.4 mg total) by mouth daily. (Patient taking differently: Take 0.4 mg by mouth daily after breakfast.)    Scheduled Meds: . heparin  5,000 Units Subcutaneous Q8H   Continuous Infusions: . [START ON 08/16/2020] cefTRIAXone (ROCEPHIN)  IV     PRN Meds:.acetaminophen **OR** acetaminophen, HYDROcodone-acetaminophen, metoprolol tartrate, morphine injection, ondansetron **OR** ondansetron (ZOFRAN) IV  Allergies:  Allergies  Allergen Reactions  . Ibuprofen Other (See Comments)    "Funny feeling in my head"    No family history on file.  Social History:  reports that she has never smoked. She has never used smokeless tobacco. She reports that she does not drink alcohol and does not use drugs.  ROS: A complete review of systems was performed.  All systems are negative except for pertinent findings as noted.  Physical Exam:  Vital signs in last 24 hours: Temp:  [98.2 F (36.8 C)-103.2 F (39.6 C)] 98.2 F (36.8 C) (02/15 0734)  Pulse Rate:  [96-139] 113 (02/15 1030) Resp:  [13-41] 41 (02/15 1030) BP: (99-144)/(60-84) 126/70 (02/15 1030) SpO2:  [91 %-97 %] 96 % (02/15 1030) Constitutional:  Alert and oriented, No acute distress Cardiovascular: tachy Respiratory: Normal respiratory effort GI: Abdomen is soft, nontender, nondistended, no abdominal masses GU: No CVA tenderness Lymphatic: No lymphadenopathy Neurologic: Grossly intact, no focal deficits Psychiatric: Normal mood and affect  Laboratory Data:  Recent Labs    08/15/20 0135 08/15/20 0210 08/15/20 0825  WBC 10.0  --  3.0*  HGB 14.3  14.3 16.0*  HCT 44.7 42.0 50.6*  PLT 204  --  192    Recent Labs    08/15/20 0135 08/15/20 0210 08/15/20 0825  NA 136 137 139  K 3.9 4.0 4.7  CL 101 103 103  GLUCOSE 118* 110* 132*  BUN 20 19 18   CALCIUM 10.2  --  10.9*  CREATININE 1.62* 1.60* 1.82*     Results for orders placed or performed during the hospital encounter of 08/15/20 (from the past 24 hour(s))  Lactic acid, plasma     Status: None   Collection Time: 08/15/20  1:35 AM  Result Value Ref Range   Lactic Acid, Venous 1.8 0.5 - 1.9 mmol/L  Comprehensive metabolic panel     Status: Abnormal   Collection Time: 08/15/20  1:35 AM  Result Value Ref Range   Sodium 136 135 - 145 mmol/L   Potassium 3.9 3.5 - 5.1 mmol/L   Chloride 101 98 - 111 mmol/L   CO2 23 22 - 32 mmol/L   Glucose, Bld 118 (H) 70 - 99 mg/dL   BUN 20 6 - 20 mg/dL   Creatinine, Ser 1.611.62 (H) 0.44 - 1.00 mg/dL   Calcium 09.610.2 8.9 - 04.510.3 mg/dL   Total Protein 6.5 6.5 - 8.1 g/dL   Albumin 3.8 3.5 - 5.0 g/dL   AST 16 15 - 41 U/L   ALT 23 0 - 44 U/L   Alkaline Phosphatase 93 38 - 126 U/L   Total Bilirubin 2.6 (H) 0.3 - 1.2 mg/dL   GFR, Estimated 37 (L) >60 mL/min   Anion gap 12 5 - 15  CBC WITH DIFFERENTIAL     Status: Abnormal   Collection Time: 08/15/20  1:35 AM  Result Value Ref Range   WBC 10.0 4.0 - 10.5 K/uL   RBC 4.97 3.87 - 5.11 MIL/uL   Hemoglobin 14.3 12.0 - 15.0 g/dL   HCT 40.944.7 81.136.0 - 91.446.0 %   MCV 89.9 80.0 - 100.0 fL   MCH 28.8 26.0 - 34.0 pg   MCHC 32.0 30.0 - 36.0 g/dL   RDW 78.213.8 95.611.5 - 21.315.5 %   Platelets 204 150 - 400 K/uL   nRBC 0.0 0.0 - 0.2 %   Neutrophils Relative % 92 %   Neutro Abs 9.3 (H) 1.7 - 7.7 K/uL   Lymphocytes Relative 4 %   Lymphs Abs 0.4 (L) 0.7 - 4.0 K/uL   Monocytes Relative 2 %   Monocytes Absolute 0.2 0.1 - 1.0 K/uL   Eosinophils Relative 1 %   Eosinophils Absolute 0.1 0.0 - 0.5 K/uL   Basophils Relative 0 %   Basophils Absolute 0.0 0.0 - 0.1 K/uL   Immature Granulocytes 1 %   Abs Immature Granulocytes  0.06 0.00 - 0.07 K/uL  Protime-INR     Status: None   Collection Time: 08/15/20  1:35 AM  Result Value Ref Range   Prothrombin Time 14.3 11.4 - 15.2 seconds  INR 1.2 0.8 - 1.2  APTT     Status: Abnormal   Collection Time: 08/15/20  1:35 AM  Result Value Ref Range   aPTT 22 (L) 24 - 36 seconds  Resp Panel by RT-PCR (Flu A&B, Covid) Nasopharyngeal Swab     Status: None   Collection Time: 08/15/20  1:58 AM   Specimen: Nasopharyngeal Swab; Nasopharyngeal(NP) swabs in vial transport medium  Result Value Ref Range   SARS Coronavirus 2 by RT PCR NEGATIVE NEGATIVE   Influenza A by PCR NEGATIVE NEGATIVE   Influenza B by PCR NEGATIVE NEGATIVE  I-Stat Chem 8, ED     Status: Abnormal   Collection Time: 08/15/20  2:10 AM  Result Value Ref Range   Sodium 137 135 - 145 mmol/L   Potassium 4.0 3.5 - 5.1 mmol/L   Chloride 103 98 - 111 mmol/L   BUN 19 6 - 20 mg/dL   Creatinine, Ser 1.61 (H) 0.44 - 1.00 mg/dL   Glucose, Bld 096 (H) 70 - 99 mg/dL   Calcium, Ion 0.45 4.09 - 1.40 mmol/L   TCO2 24 22 - 32 mmol/L   Hemoglobin 14.3 12.0 - 15.0 g/dL   HCT 81.1 91.4 - 78.2 %  Urinalysis, Routine w reflex microscopic     Status: Abnormal   Collection Time: 08/15/20  3:20 AM  Result Value Ref Range   Color, Urine YELLOW YELLOW   APPearance CLEAR CLEAR   Specific Gravity, Urine 1.006 1.005 - 1.030   pH 6.0 5.0 - 8.0   Glucose, UA NEGATIVE NEGATIVE mg/dL   Hgb urine dipstick MODERATE (A) NEGATIVE   Bilirubin Urine NEGATIVE NEGATIVE   Ketones, ur NEGATIVE NEGATIVE mg/dL   Protein, ur NEGATIVE NEGATIVE mg/dL   Nitrite NEGATIVE NEGATIVE   Leukocytes,Ua NEGATIVE NEGATIVE   RBC / HPF 0-5 0 - 5 RBC/hpf   WBC, UA 0-5 0 - 5 WBC/hpf   Bacteria, UA NONE SEEN NONE SEEN   Squamous Epithelial / LPF 0-5 0 - 5  CBC with Differential     Status: Abnormal   Collection Time: 08/15/20  8:25 AM  Result Value Ref Range   WBC 3.0 (L) 4.0 - 10.5 K/uL   RBC 5.63 (H) 3.87 - 5.11 MIL/uL   Hemoglobin 16.0 (H) 12.0 - 15.0  g/dL   HCT 95.6 (H) 21.3 - 08.6 %   MCV 89.9 80.0 - 100.0 fL   MCH 28.4 26.0 - 34.0 pg   MCHC 31.6 30.0 - 36.0 g/dL   RDW 57.8 46.9 - 62.9 %   Platelets 192 150 - 400 K/uL   nRBC 0.0 0.0 - 0.2 %   Neutrophils Relative % 86 %   Neutro Abs 2.6 1.7 - 7.7 K/uL   Lymphocytes Relative 11 %   Lymphs Abs 0.3 (L) 0.7 - 4.0 K/uL   Monocytes Relative 2 %   Monocytes Absolute 0.1 0.1 - 1.0 K/uL   Eosinophils Relative 0 %   Eosinophils Absolute 0.0 0.0 - 0.5 K/uL   Basophils Relative 0 %   Basophils Absolute 0.0 0.0 - 0.1 K/uL   Immature Granulocytes 1 %   Abs Immature Granulocytes 0.02 0.00 - 0.07 K/uL   Reactive, Benign Lymphocytes PRESENT   Comprehensive metabolic panel     Status: Abnormal   Collection Time: 08/15/20  8:25 AM  Result Value Ref Range   Sodium 139 135 - 145 mmol/L   Potassium 4.7 3.5 - 5.1 mmol/L   Chloride 103 98 - 111 mmol/L  CO2 22 22 - 32 mmol/L   Glucose, Bld 132 (H) 70 - 99 mg/dL   BUN 18 6 - 20 mg/dL   Creatinine, Ser 4.01 (H) 0.44 - 1.00 mg/dL   Calcium 02.7 (H) 8.9 - 10.3 mg/dL   Total Protein 7.8 6.5 - 8.1 g/dL   Albumin 4.3 3.5 - 5.0 g/dL   AST 26 15 - 41 U/L   ALT 25 0 - 44 U/L   Alkaline Phosphatase 108 38 - 126 U/L   Total Bilirubin 2.8 (H) 0.3 - 1.2 mg/dL   GFR, Estimated 32 (L) >60 mL/min   Anion gap 14 5 - 15  Protime-INR     Status: None   Collection Time: 08/15/20  8:25 AM  Result Value Ref Range   Prothrombin Time 13.8 11.4 - 15.2 seconds   INR 1.1 0.8 - 1.2  APTT     Status: None   Collection Time: 08/15/20  8:25 AM  Result Value Ref Range   aPTT 26 24 - 36 seconds   Recent Results (from the past 240 hour(s))  Urine culture     Status: None   Collection Time: 08/12/20  9:23 PM   Specimen: Urine, Clean Catch  Result Value Ref Range Status   Specimen Description   Final    URINE, CLEAN CATCH Performed at [facility], [address] [place], [place] [address]    Special Requests   Final    NONE Performed at [facility], [address] [place], [place] [address]    Culture   Final    NO GROWTH Performed at [facility], [address], [place], [place] [address]    Report Status 08/14/2020 FINAL  Final  Resp Panel by RT-PCR (Flu A&B, Covid) Nasopharyngeal Swab     Status: None   Collection Time: 08/15/20  1:58 AM   Specimen: Nasopharyngeal Swab; Nasopharyngeal(NP) swabs in vial transport medium  Result Value Ref Range Status   SARS Coronavirus 2 by RT PCR NEGATIVE NEGATIVE Final    Comment: (NOTE) SARS-CoV-2 target nucleic acids are NOT DETECTED.  The SARS-CoV-2 RNA is generally detectable in upper respiratory specimens during the acute phase of infection. The lowest concentration of SARS-CoV-2 viral copies this assay can detect is 138 copies/mL. A negative result does not preclude SARS-Cov-2 infection and should not be used as the sole basis for treatment or other patient management decisions. A negative result may occur with  improper specimen collection/handling, submission of specimen other than nasopharyngeal swab, presence of viral mutation(s) within the areas targeted by this assay, and inadequate number of viral copies(<138 copies/mL). A negative result must be combined with clinical observations, patient history, and epidemiological information. The expected result is Negative.  Fact Sheet for Patients:  BloggerCourse.com  Fact Sheet for Healthcare Providers:  SeriousBroker.it  This test is no t yet approved or cleared by the [place] FDA and  has been authorized for detection and/or diagnosis of SARS-CoV-2 by FDA under an Emergency Use Authorization (EUA). This EUA will remain  in effect (meaning this test can be used) for the duration of the COVID-19 declaration under Section 564(b)(1) of the Act, 21 U.S.C.section 360bbb-3(b)(1), unless the authorization is terminated  or revoked sooner.       Influenza A  by PCR NEGATIVE NEGATIVE Final   Influenza B by PCR NEGATIVE NEGATIVE Final    Comment: (NOTE) The Xpert Xpress SARS-CoV-2/FLU/RSV plus assay is intended as  an aid in the diagnosis of influenza from Nasopharyngeal swab specimens and should not be used as a sole basis for treatment. Nasal washings and aspirates are unacceptable for Xpert Xpress SARS-CoV-2/FLU/RSV testing.  Fact Sheet for Patients: BloggerCourse.com  Fact Sheet for Healthcare Providers: SeriousBroker.it  This test is not yet approved or cleared by the [place] FDA and has been authorized for detection and/or diagnosis of SARS-CoV-2 by FDA under an Emergency Use Authorization (EUA). This EUA will remain in effect (meaning this test can be used) for the duration of the COVID-19 declaration under Section 564(b)(1) of the Act, 21 U.S.C. section 360bbb-3(b)(1), unless the authorization is terminated or revoked.  Performed at [facility], [address] [place], [place] [address]     Renal Function: Recent Labs    08/15/20 0135 08/15/20 0210 08/15/20 0825  CREATININE 1.62* 1.60* 1.82*   CrCl cannot be calculated (Unknown ideal weight.).  Radiologic Imaging: CT Angio Chest PE W and/or Wo Contrast  Result Date: 08/15/2020 CLINICAL DATA:  Left flank pain, shortness of breath, white blood cell 10, negative COVID. Pulmonary embolus suspected. Recent diagnosis of renal stone on 08/12/2020. EXAM: CT ANGIOGRAPHY CHEST WITH CONTRAST TECHNIQUE: Multidetector CT imaging of the chest was performed using the standard protocol during bolus administration of intravenous contrast. Multiplanar CT image reconstructions and MIPs were obtained to evaluate the vascular anatomy. CONTRAST:  54mL OMNIPAQUE IOHEXOL 350 MG/ML SOLN COMPARISON:  CT renal 08/12/2020, chest x-ray 08/15/2020 FINDINGS: Cardiovascular: Satisfactory opacification of the pulmonary arteries to  the segmental level. No evidence of pulmonary embolism. The main pulmonary artery is enlarged in caliber. Normal heart size. No significant pericardial effusion. The thoracic aorta is normal in caliber. No atherosclerotic plaque of the thoracic aorta. No coronary artery calcifications. Mediastinum/Nodes: Enlarged right paratracheal lymph node measuring up to 1.6 cm. Borderline enlarged prevascular lymph node measuring 0.9 cm. Prominent but nonenlarged hilar lymph nodes. No axillary lymph nodes. Thyroid gland, trachea, and esophagus demonstrate no significant findings. Lungs/Pleura: Low lung volumes. Bilateral lower lobe subsegmental atelectasis. No focal consolidation. No pulmonary nodule. No pulmonary mass. No pleural effusion. No pneumothorax. Upper Abdomen: No acute abnormality. Musculoskeletal: No chest wall abnormality No suspicious lytic or blastic osseous lesions. No acute displaced fracture. Multilevel mild degenerative changes of the spine. Review of the MIP images confirms the above findings. IMPRESSION: 1. No central or segmental pulmonary embolus. Limited evaluation more distally due to artifact. 2. Low lung volumes with no acute intrathoracic abnormality. Electronically Signed   By: Tish Frederickson M.D.   On: 08/15/2020 05:21   DG Chest Port 1 View  Result Date: 08/15/2020 CLINICAL DATA:  Shortness of breath. EXAM: PORTABLE CHEST 1 VIEW COMPARISON:  Lung bases from abdominal CT 3 days ago 08/12/2020 FINDINGS: Lung volumes are low. Mild elevation of right hemidiaphragm. Mild bibasilar atelectasis. No confluent consolidation. Normal heart size. No pleural fluid or pneumothorax. No acute osseous abnormalities are seen. IMPRESSION: Low lung volumes with mild bibasilar atelectasis. Electronically Signed   By: Narda Rutherford M.D.   On: 08/15/2020 02:05    Impression/Recommendation:  1.) Obstructing left UPJ stone with moderate hydronephrosis--pt will be taken to OR later today by Dr. Alvester Morin for left  ureteral stent placement.  She will need lithotripsy to treat stone after infection is cleared. Repeat Cr after stent placed.    2.) Sepsis--likely due to UTI.  Pt to be admitted by IM.  Continue Rocephin. Urine culture on Sat and UA today are negative, however, pt  was on Abx at time both were collected (macrobid and keflex).  F/u blood and urine cultures.  Harrie Foreman 08/15/2020, 10:38 AM

## 2020-08-15 NOTE — Progress Notes (Signed)
Received patient from PACU, telemetry applied, VS obtained, oriented to unit, call light placed in reach

## 2020-08-15 NOTE — Anesthesia Postprocedure Evaluation (Signed)
Anesthesia Post Note  Patient: Candace Wright  Procedure(s) Performed: CYSTOSCOPY WITH RETROGRADE PYELOGRAM/URETERAL STENT PLACEMENT (Left Bladder)     Patient location during evaluation: PACU Anesthesia Type: General Level of consciousness: awake and alert Pain management: pain level controlled Vital Signs Assessment: post-procedure vital signs reviewed and stable Respiratory status: spontaneous breathing, nonlabored ventilation, respiratory function stable and patient connected to nasal cannula oxygen Cardiovascular status: blood pressure returned to baseline and stable Postop Assessment: no apparent nausea or vomiting Anesthetic complications: no   No complications documented.  Last Vitals:  Vitals:   08/15/20 1445 08/15/20 1547  BP:  (!) 104/56  Pulse:  93  Resp:    Temp:  37.3 C  SpO2: 94% 97%    Last Pain:  Vitals:   08/15/20 1547  TempSrc: Oral  PainSc: 0-No pain                 Shelton Silvas

## 2020-08-15 NOTE — ED Notes (Signed)
Pt unable to provide urine specimen.  

## 2020-08-15 NOTE — Interval H&P Note (Signed)
History and Physical Interval Note:  08/15/2020 1:01 PM  Candace Wright  has presented today for surgery, with the diagnosis of sepsis ureteral stone.  The various methods of treatment have been discussed with the patient and family. After consideration of risks, benefits and other options for treatment, the patient has consented to  Procedure(s): CYSTOSCOPY WITH RETROGRADE PYELOGRAM/URETERAL STENT PLACEMENT (Left) as a surgical intervention.  The patient's history has been reviewed, patient examined, no change in status, stable for surgery.  I have reviewed the patient's chart and labs.  Questions were answered to the patient's satisfaction.    Patient seen and examined independently of the PA.  Agree with assessment and we will proceed with emergent left ureteral stent.  Ray Church, III

## 2020-08-15 NOTE — H&P (View-Only) (Signed)
Urology Consult   Physician requesting consult: Jared Segal  Reason for consult: Nephrolihiasis  History of Present Illness: Candace Wright is a 59 y.o. female with PMH significant for UTIs, GERD, and nephrolithiasis who presented to the ED last night with c/o SOB and chills.  Her SOB improved with O2 via [place] by EMS and chills subsided in the ED.  Temp in ED was 102, she was tachypneic and tachycardic all of which have improved. CXR shows low lung volumes with bibasilar ATX and CTA chest shows no acute intrathoracic abnormality. Cr 1.6, WBC 10. UA negative (on ABx). She was started on IVF, IV Rocephin, albuterol, and tylenol.   She was evaled in the ED 3 days ago for left flank pain and work up revealed 9mm left UPJ stone with moderate hydro.  She was d/c'd home with pain meds, a 7 day course of Keflex, and instructed to f/u with [facility] which she did on Monday.  She was asymptomatic at the time and was scheduled to have a lithotripsy in the near future. Pt has a hx of UTIs for which she has been evaled at [facility].  Prior to her presentation to the ED on Sat she developed mild UTI sx and self treated with a 5 day course of macrobid. Urine culture collected in ED was negative. She developed constipation after starting pain meds over the weekend and started [place].  She then developed diarrhea which has since resolved.  She is currently resting comfortably in the ED.  She denies HA, F/C, CP, SOB, N/V, diarrhea/constipation, and flank pain.  She is currently AF but still tachycardic. BP stable.   Past Medical History:  Diagnosis Date  . GERD (gastroesophageal reflux disease)   . Left ureteral stone   . Wears glasses     Past Surgical History:  Procedure Laterality Date  . LAPAROSCOPIC TUBAL LIGATION  1994  . ORIF  LEFT ANKLE FX  2000  . REMOVAL HARDWARE LEFT ANKLE   2001     Current Hospital Medications:  Home meds:  . Current Meds  Medication Sig  . cephALEXin  (KEFLEX) 500 MG capsule Take 1 capsule (500 mg total) by mouth 4 (four) times daily for 7 days.  . HYDROcodone-acetaminophen (NORCO) 5-325 MG tablet Take 1 tablet by mouth every 6 (six) hours as needed for moderate pain.  . ondansetron (ZOFRAN) 4 MG tablet Take 4 mg by mouth every 8 (eight) hours as needed for nausea or vomiting.  . tamsulosin (FLOMAX) 0.4 MG CAPS capsule Take 1 capsule (0.4 mg total) by mouth daily. (Patient taking differently: Take 0.4 mg by mouth daily after breakfast.)    Scheduled Meds: . heparin  5,000 Units Subcutaneous Q8H   Continuous Infusions: . [START ON 08/16/2020] cefTRIAXone (ROCEPHIN)  IV     PRN Meds:.acetaminophen **OR** acetaminophen, HYDROcodone-acetaminophen, metoprolol tartrate, morphine injection, ondansetron **OR** ondansetron (ZOFRAN) IV  Allergies:  Allergies  Allergen Reactions  . Ibuprofen Other (See Comments)    "Funny feeling in my head"    No family history on file.  Social History:  reports that she has never smoked. She has never used smokeless tobacco. She reports that she does not drink alcohol and does not use drugs.  ROS: A complete review of systems was performed.  All systems are negative except for pertinent findings as noted.  Physical Exam:  Vital signs in last 24 hours: Temp:  [98.2 F (36.8 C)-103.2 F (39.6 C)] 98.2 F (36.8 C) (02/15 0734)   Pulse Rate:  [96-139] 113 (02/15 1030) Resp:  [13-41] 41 (02/15 1030) BP: (99-144)/(60-84) 126/70 (02/15 1030) SpO2:  [91 %-97 %] 96 % (02/15 1030) Constitutional:  Alert and oriented, No acute distress Cardiovascular: tachy Respiratory: Normal respiratory effort GI: Abdomen is soft, nontender, nondistended, no abdominal masses GU: No CVA tenderness Lymphatic: No lymphadenopathy Neurologic: Grossly intact, no focal deficits Psychiatric: Normal mood and affect  Laboratory Data:  Recent Labs    08/15/20 0135 08/15/20 0210 08/15/20 0825  WBC 10.0  --  3.0*  HGB 14.3  14.3 16.0*  HCT 44.7 42.0 50.6*  PLT 204  --  192    Recent Labs    08/15/20 0135 08/15/20 0210 08/15/20 0825  NA 136 137 139  K 3.9 4.0 4.7  CL 101 103 103  GLUCOSE 118* 110* 132*  BUN 20 19 18   CALCIUM 10.2  --  10.9*  CREATININE 1.62* 1.60* 1.82*     Results for orders placed or performed during the hospital encounter of 08/15/20 (from the past 24 hour(s))  Lactic acid, plasma     Status: None   Collection Time: 08/15/20  1:35 AM  Result Value Ref Range   Lactic Acid, Venous 1.8 0.5 - 1.9 mmol/L  Comprehensive metabolic panel     Status: Abnormal   Collection Time: 08/15/20  1:35 AM  Result Value Ref Range   Sodium 136 135 - 145 mmol/L   Potassium 3.9 3.5 - 5.1 mmol/L   Chloride 101 98 - 111 mmol/L   CO2 23 22 - 32 mmol/L   Glucose, Bld 118 (H) 70 - 99 mg/dL   BUN 20 6 - 20 mg/dL   Creatinine, Ser 1.611.62 (H) 0.44 - 1.00 mg/dL   Calcium 09.610.2 8.9 - 04.510.3 mg/dL   Total Protein 6.5 6.5 - 8.1 g/dL   Albumin 3.8 3.5 - 5.0 g/dL   AST 16 15 - 41 U/L   ALT 23 0 - 44 U/L   Alkaline Phosphatase 93 38 - 126 U/L   Total Bilirubin 2.6 (H) 0.3 - 1.2 mg/dL   GFR, Estimated 37 (L) >60 mL/min   Anion gap 12 5 - 15  CBC WITH DIFFERENTIAL     Status: Abnormal   Collection Time: 08/15/20  1:35 AM  Result Value Ref Range   WBC 10.0 4.0 - 10.5 K/uL   RBC 4.97 3.87 - 5.11 MIL/uL   Hemoglobin 14.3 12.0 - 15.0 g/dL   HCT 40.944.7 81.136.0 - 91.446.0 %   MCV 89.9 80.0 - 100.0 fL   MCH 28.8 26.0 - 34.0 pg   MCHC 32.0 30.0 - 36.0 g/dL   RDW 78.213.8 95.611.5 - 21.315.5 %   Platelets 204 150 - 400 K/uL   nRBC 0.0 0.0 - 0.2 %   Neutrophils Relative % 92 %   Neutro Abs 9.3 (H) 1.7 - 7.7 K/uL   Lymphocytes Relative 4 %   Lymphs Abs 0.4 (L) 0.7 - 4.0 K/uL   Monocytes Relative 2 %   Monocytes Absolute 0.2 0.1 - 1.0 K/uL   Eosinophils Relative 1 %   Eosinophils Absolute 0.1 0.0 - 0.5 K/uL   Basophils Relative 0 %   Basophils Absolute 0.0 0.0 - 0.1 K/uL   Immature Granulocytes 1 %   Abs Immature Granulocytes  0.06 0.00 - 0.07 K/uL  Protime-INR     Status: None   Collection Time: 08/15/20  1:35 AM  Result Value Ref Range   Prothrombin Time 14.3 11.4 - 15.2 seconds  INR 1.2 0.8 - 1.2  APTT     Status: Abnormal   Collection Time: 08/15/20  1:35 AM  Result Value Ref Range   aPTT 22 (L) 24 - 36 seconds  Resp Panel by RT-PCR (Flu A&B, Covid) Nasopharyngeal Swab     Status: None   Collection Time: 08/15/20  1:58 AM   Specimen: Nasopharyngeal Swab; Nasopharyngeal(NP) swabs in vial transport medium  Result Value Ref Range   SARS Coronavirus 2 by RT PCR NEGATIVE NEGATIVE   Influenza A by PCR NEGATIVE NEGATIVE   Influenza B by PCR NEGATIVE NEGATIVE  I-Stat Chem 8, ED     Status: Abnormal   Collection Time: 08/15/20  2:10 AM  Result Value Ref Range   Sodium 137 135 - 145 mmol/L   Potassium 4.0 3.5 - 5.1 mmol/L   Chloride 103 98 - 111 mmol/L   BUN 19 6 - 20 mg/dL   Creatinine, Ser 1.61 (H) 0.44 - 1.00 mg/dL   Glucose, Bld 096 (H) 70 - 99 mg/dL   Calcium, Ion 0.45 4.09 - 1.40 mmol/L   TCO2 24 22 - 32 mmol/L   Hemoglobin 14.3 12.0 - 15.0 g/dL   HCT 81.1 91.4 - 78.2 %  Urinalysis, Routine w reflex microscopic     Status: Abnormal   Collection Time: 08/15/20  3:20 AM  Result Value Ref Range   Color, Urine YELLOW YELLOW   APPearance CLEAR CLEAR   Specific Gravity, Urine 1.006 1.005 - 1.030   pH 6.0 5.0 - 8.0   Glucose, UA NEGATIVE NEGATIVE mg/dL   Hgb urine dipstick MODERATE (A) NEGATIVE   Bilirubin Urine NEGATIVE NEGATIVE   Ketones, ur NEGATIVE NEGATIVE mg/dL   Protein, ur NEGATIVE NEGATIVE mg/dL   Nitrite NEGATIVE NEGATIVE   Leukocytes,Ua NEGATIVE NEGATIVE   RBC / HPF 0-5 0 - 5 RBC/hpf   WBC, UA 0-5 0 - 5 WBC/hpf   Bacteria, UA NONE SEEN NONE SEEN   Squamous Epithelial / LPF 0-5 0 - 5  CBC with Differential     Status: Abnormal   Collection Time: 08/15/20  8:25 AM  Result Value Ref Range   WBC 3.0 (L) 4.0 - 10.5 K/uL   RBC 5.63 (H) 3.87 - 5.11 MIL/uL   Hemoglobin 16.0 (H) 12.0 - 15.0  g/dL   HCT 95.6 (H) 21.3 - 08.6 %   MCV 89.9 80.0 - 100.0 fL   MCH 28.4 26.0 - 34.0 pg   MCHC 31.6 30.0 - 36.0 g/dL   RDW 57.8 46.9 - 62.9 %   Platelets 192 150 - 400 K/uL   nRBC 0.0 0.0 - 0.2 %   Neutrophils Relative % 86 %   Neutro Abs 2.6 1.7 - 7.7 K/uL   Lymphocytes Relative 11 %   Lymphs Abs 0.3 (L) 0.7 - 4.0 K/uL   Monocytes Relative 2 %   Monocytes Absolute 0.1 0.1 - 1.0 K/uL   Eosinophils Relative 0 %   Eosinophils Absolute 0.0 0.0 - 0.5 K/uL   Basophils Relative 0 %   Basophils Absolute 0.0 0.0 - 0.1 K/uL   Immature Granulocytes 1 %   Abs Immature Granulocytes 0.02 0.00 - 0.07 K/uL   Reactive, Benign Lymphocytes PRESENT   Comprehensive metabolic panel     Status: Abnormal   Collection Time: 08/15/20  8:25 AM  Result Value Ref Range   Sodium 139 135 - 145 mmol/L   Potassium 4.7 3.5 - 5.1 mmol/L   Chloride 103 98 - 111 mmol/L  CO2 22 22 - 32 mmol/L   Glucose, Bld 132 (H) 70 - 99 mg/dL   BUN 18 6 - 20 mg/dL   Creatinine, Ser 4.01 (H) 0.44 - 1.00 mg/dL   Calcium 02.7 (H) 8.9 - 10.3 mg/dL   Total Protein 7.8 6.5 - 8.1 g/dL   Albumin 4.3 3.5 - 5.0 g/dL   AST 26 15 - 41 U/L   ALT 25 0 - 44 U/L   Alkaline Phosphatase 108 38 - 126 U/L   Total Bilirubin 2.8 (H) 0.3 - 1.2 mg/dL   GFR, Estimated 32 (L) >60 mL/min   Anion gap 14 5 - 15  Protime-INR     Status: None   Collection Time: 08/15/20  8:25 AM  Result Value Ref Range   Prothrombin Time 13.8 11.4 - 15.2 seconds   INR 1.1 0.8 - 1.2  APTT     Status: None   Collection Time: 08/15/20  8:25 AM  Result Value Ref Range   aPTT 26 24 - 36 seconds   Recent Results (from the past 240 hour(s))  Urine culture     Status: None   Collection Time: 08/12/20  9:23 PM   Specimen: Urine, Clean Catch  Result Value Ref Range Status   Specimen Description   Final    URINE, CLEAN CATCH Performed at [facility], [address] [place], [place] [address]    Special Requests   Final    NONE Performed at [facility], [address] [place], [place] [address]    Culture   Final    NO GROWTH Performed at [facility], [address], [place], [place] [address]    Report Status 08/14/2020 FINAL  Final  Resp Panel by RT-PCR (Flu A&B, Covid) Nasopharyngeal Swab     Status: None   Collection Time: 08/15/20  1:58 AM   Specimen: Nasopharyngeal Swab; Nasopharyngeal(NP) swabs in vial transport medium  Result Value Ref Range Status   SARS Coronavirus 2 by RT PCR NEGATIVE NEGATIVE Final    Comment: (NOTE) SARS-CoV-2 target nucleic acids are NOT DETECTED.  The SARS-CoV-2 RNA is generally detectable in upper respiratory specimens during the acute phase of infection. The lowest concentration of SARS-CoV-2 viral copies this assay can detect is 138 copies/mL. A negative result does not preclude SARS-Cov-2 infection and should not be used as the sole basis for treatment or other patient management decisions. A negative result may occur with  improper specimen collection/handling, submission of specimen other than nasopharyngeal swab, presence of viral mutation(s) within the areas targeted by this assay, and inadequate number of viral copies(<138 copies/mL). A negative result must be combined with clinical observations, patient history, and epidemiological information. The expected result is Negative.  Fact Sheet for Patients:  BloggerCourse.com  Fact Sheet for Healthcare Providers:  SeriousBroker.it  This test is no t yet approved or cleared by the [place] FDA and  has been authorized for detection and/or diagnosis of SARS-CoV-2 by FDA under an Emergency Use Authorization (EUA). This EUA will remain  in effect (meaning this test can be used) for the duration of the COVID-19 declaration under Section 564(b)(1) of the Act, 21 U.S.C.section 360bbb-3(b)(1), unless the authorization is terminated  or revoked sooner.       Influenza A  by PCR NEGATIVE NEGATIVE Final   Influenza B by PCR NEGATIVE NEGATIVE Final    Comment: (NOTE) The Xpert Xpress SARS-CoV-2/FLU/RSV plus assay is intended as  an aid in the diagnosis of influenza from Nasopharyngeal swab specimens and should not be used as a sole basis for treatment. Nasal washings and aspirates are unacceptable for Xpert Xpress SARS-CoV-2/FLU/RSV testing.  Fact Sheet for Patients: BloggerCourse.com  Fact Sheet for Healthcare Providers: SeriousBroker.it  This test is not yet approved or cleared by the [place] FDA and has been authorized for detection and/or diagnosis of SARS-CoV-2 by FDA under an Emergency Use Authorization (EUA). This EUA will remain in effect (meaning this test can be used) for the duration of the COVID-19 declaration under Section 564(b)(1) of the Act, 21 U.S.C. section 360bbb-3(b)(1), unless the authorization is terminated or revoked.  Performed at [facility], [address] [place], [place] [address]     Renal Function: Recent Labs    08/15/20 0135 08/15/20 0210 08/15/20 0825  CREATININE 1.62* 1.60* 1.82*   CrCl cannot be calculated (Unknown ideal weight.).  Radiologic Imaging: CT Angio Chest PE W and/or Wo Contrast  Result Date: 08/15/2020 CLINICAL DATA:  Left flank pain, shortness of breath, white blood cell 10, negative COVID. Pulmonary embolus suspected. Recent diagnosis of renal stone on 08/12/2020. EXAM: CT ANGIOGRAPHY CHEST WITH CONTRAST TECHNIQUE: Multidetector CT imaging of the chest was performed using the standard protocol during bolus administration of intravenous contrast. Multiplanar CT image reconstructions and MIPs were obtained to evaluate the vascular anatomy. CONTRAST:  54mL OMNIPAQUE IOHEXOL 350 MG/ML SOLN COMPARISON:  CT renal 08/12/2020, chest x-ray 08/15/2020 FINDINGS: Cardiovascular: Satisfactory opacification of the pulmonary arteries to  the segmental level. No evidence of pulmonary embolism. The main pulmonary artery is enlarged in caliber. Normal heart size. No significant pericardial effusion. The thoracic aorta is normal in caliber. No atherosclerotic plaque of the thoracic aorta. No coronary artery calcifications. Mediastinum/Nodes: Enlarged right paratracheal lymph node measuring up to 1.6 cm. Borderline enlarged prevascular lymph node measuring 0.9 cm. Prominent but nonenlarged hilar lymph nodes. No axillary lymph nodes. Thyroid gland, trachea, and esophagus demonstrate no significant findings. Lungs/Pleura: Low lung volumes. Bilateral lower lobe subsegmental atelectasis. No focal consolidation. No pulmonary nodule. No pulmonary mass. No pleural effusion. No pneumothorax. Upper Abdomen: No acute abnormality. Musculoskeletal: No chest wall abnormality No suspicious lytic or blastic osseous lesions. No acute displaced fracture. Multilevel mild degenerative changes of the spine. Review of the MIP images confirms the above findings. IMPRESSION: 1. No central or segmental pulmonary embolus. Limited evaluation more distally due to artifact. 2. Low lung volumes with no acute intrathoracic abnormality. Electronically Signed   By: Tish Frederickson M.D.   On: 08/15/2020 05:21   DG Chest Port 1 View  Result Date: 08/15/2020 CLINICAL DATA:  Shortness of breath. EXAM: PORTABLE CHEST 1 VIEW COMPARISON:  Lung bases from abdominal CT 3 days ago 08/12/2020 FINDINGS: Lung volumes are low. Mild elevation of right hemidiaphragm. Mild bibasilar atelectasis. No confluent consolidation. Normal heart size. No pleural fluid or pneumothorax. No acute osseous abnormalities are seen. IMPRESSION: Low lung volumes with mild bibasilar atelectasis. Electronically Signed   By: Narda Rutherford M.D.   On: 08/15/2020 02:05    Impression/Recommendation:  1.) Obstructing left UPJ stone with moderate hydronephrosis--pt will be taken to OR later today by Dr. Alvester Morin for left  ureteral stent placement.  She will need lithotripsy to treat stone after infection is cleared. Repeat Cr after stent placed.    2.) Sepsis--likely due to UTI.  Pt to be admitted by IM.  Continue Rocephin. Urine culture on Sat and UA today are negative, however, pt  was on Abx at time both were collected (macrobid and keflex).  F/u blood and urine cultures.  Harrie Foreman 08/15/2020, 10:38 AM

## 2020-08-15 NOTE — Progress Notes (Signed)
Spoke to [employer] to get report on patient for upcoming surgery scheduled for 1310 today,08/15/20.

## 2020-08-15 NOTE — Interval H&P Note (Signed)
History and Physical Interval Note:  08/15/2020 12:58 PM  Candace Wright  has presented today for surgery, with the diagnosis of sepsis ureteral stone.  The various methods of treatment have been discussed with the patient and family. After consideration of risks, benefits and other options for treatment, the patient has consented to  Procedure(s): CYSTOSCOPY WITH RETROGRADE PYELOGRAM/URETERAL STENT PLACEMENT (Left) as a surgical intervention.  The patient's history has been reviewed, patient examined, no change in status, stable for surgery.  I have reviewed the patient's chart and labs.  Questions were answered to the patient's satisfaction.     Ray Church, III

## 2020-08-15 NOTE — ED Provider Notes (Signed)
[facility]-EMERGENCY DEPT Provider Note  CSN: [id number] Arrival date & time: 08/15/20 0102  Chief Complaint(s) Flank Pain  HPI Candace Wright is a 60 y.o. female with a past medical history listed below including known 9 mm left UPJ stone currently being followed by urology, currently being treated for possible urinary tract infection with Keflex provided to her on February 12 who presents to the emergency department with chills and shortness of breath. Patient reports that for the past several days she has been feeling generalized fatigue and has been sitting around for most of the day. She denies cough or congestion. Some nausea, but no emesis.  She is reporting some mild chest tightness with the shortness of breath.  Reports that her shortness of breath improved after EMS provided her with oxygen.  She denies any prior history of DVTs or PEs.  She denies any hormone replacement therapy or OCPs.  No history of cancer.    HPI  Past Medical History Past Medical History:  Diagnosis Date  . GERD (gastroesophageal reflux disease)   . Left ureteral stone   . Wears glasses    There are no problems to display for this patient.  Home Medication(s) Prior to Admission medications   Medication Sig Start Date End Date Taking? Authorizing Provider  cephALEXin (KEFLEX) 500 MG capsule Take 1 capsule (500 mg total) by mouth 4 (four) times daily for 7 days. 08/13/20 08/20/20 Yes Long, Arlyss Repress, MD  HYDROcodone-acetaminophen (NORCO) 5-325 MG tablet Take 1 tablet by mouth every 6 (six) hours as needed for moderate pain. 01/09/16  Yes Alvira Monday, MD  ondansetron (ZOFRAN) 4 MG tablet Take 4 mg by mouth every 8 (eight) hours as needed for nausea or vomiting. 08/11/20  Yes [provider]  tamsulosin (FLOMAX) 0.4 MG CAPS capsule Take 1 capsule (0.4 mg total) by mouth daily. Patient taking differently: Take 0.4 mg by mouth daily after breakfast. 01/09/16  Yes Alvira Monday, MD                                                                                                                                    Past Surgical History Past Surgical History:  Procedure Laterality Date  . LAPAROSCOPIC TUBAL LIGATION  1994  . ORIF  LEFT ANKLE FX  2000  . REMOVAL HARDWARE LEFT ANKLE   2001   Family History No family history on file.  Social History Social History   Tobacco Use  . Smoking status: Never Smoker  . Smokeless tobacco: Never Used  Substance Use Topics  . Alcohol use: No  . Drug use: No   Allergies Ibuprofen  Review of Systems Review of Systems All other systems are reviewed and are negative for acute change except as noted in the HPI  Physical Exam Vital Signs  I have reviewed the triage vital signs BP (!) 141/75 (BP Location: Right Arm)   Pulse Marland Kitchen)  139   Temp 103.2 F (rectal)   Resp (!) 23   SpO2 95%   Physical Exam Vitals reviewed.  Constitutional:      General: She is not in acute distress.    Appearance: She is well-developed and well-nourished. She is not diaphoretic.  HENT:     Head: Normocephalic and atraumatic.     Nose: Nose normal.  Eyes:     General: No scleral icterus.       Right eye: No discharge.        Left eye: No discharge.     Extraocular Movements: EOM normal.     Conjunctiva/sclera: Conjunctivae normal.     Pupils: Pupils are equal, round, and reactive to light.  Cardiovascular:     Rate and Rhythm: Regular rhythm. Tachycardia present.     Heart sounds: No murmur heard. No friction rub. No gallop.   Pulmonary:     Effort: Pulmonary effort is normal. No respiratory distress.     Breath sounds: Normal breath sounds. No stridor. No rales.  Abdominal:     General: There is no distension.     Palpations: Abdomen is soft.     Tenderness: There is no abdominal tenderness.  Musculoskeletal:        General: No tenderness or edema.     Cervical back: Normal range of motion and neck supple.  Skin:     General: Skin is warm and dry.     Findings: No erythema or rash.  Neurological:     Mental Status: She is alert and oriented to person, place, and time.  Psychiatric:        Mood and Affect: Mood and affect normal.     ED Results and Treatments Labs (all labs ordered are listed, but only abnormal results are displayed) Labs Reviewed  COMPREHENSIVE METABOLIC PANEL - Abnormal; Notable for the following components:      Result Value   Glucose, Bld 118 (*)    Creatinine, Ser 1.62 (*)    Total Bilirubin 2.6 (*)    GFR, Estimated 37 (*)    All other components within normal limits  CBC WITH DIFFERENTIAL/PLATELET - Abnormal; Notable for the following components:   Neutro Abs 9.3 (*)    Lymphs Abs 0.4 (*)    All other components within normal limits  APTT - Abnormal; Notable for the following components:   aPTT 22 (*)    All other components within normal limits  URINALYSIS, ROUTINE W REFLEX MICROSCOPIC - Abnormal; Notable for the following components:   Hgb urine dipstick MODERATE (*)    All other components within normal limits  I-STAT CHEM 8, ED - Abnormal; Notable for the following components:   Creatinine, Ser 1.60 (*)    Glucose, Bld 110 (*)    All other components within normal limits  RESP PANEL BY RT-PCR (FLU A&B, COVID) ARPGX2  CULTURE, BLOOD (SINGLE)  URINE CULTURE  LACTIC ACID, PLASMA  PROTIME-INR  EKG  EKG Interpretation  Date/Time:  Tuesday August 15 2020 01:20:39 EST Ventricular Rate:  129 PR Interval:    QRS Duration: 88 QT Interval:  292 QTC Calculation: 428 R Axis:   48 Text Interpretation: Sinus tachycardia Consider right atrial enlargement Abnormal R-wave progression, late transition Borderline repolarization abnormality No old tracing to compare Confirmed by Drema Pryardama, Quashon Jesus [phone]([id number]) on 08/15/2020 1:53:02 AM      Radiology CT Angio  Chest PE W and/or Wo Contrast  Result Date: 08/15/2020 CLINICAL DATA:  Left flank pain, shortness of breath, white blood cell 10, negative COVID. Pulmonary embolus suspected. Recent diagnosis of renal stone on 08/12/2020. EXAM: CT ANGIOGRAPHY CHEST WITH CONTRAST TECHNIQUE: Multidetector CT imaging of the chest was performed using the standard protocol during bolus administration of intravenous contrast. Multiplanar CT image reconstructions and MIPs were obtained to evaluate the vascular anatomy. CONTRAST:  80mL OMNIPAQUE IOHEXOL 350 MG/ML SOLN COMPARISON:  CT renal 08/12/2020, chest x-ray 08/15/2020 FINDINGS: Cardiovascular: Satisfactory opacification of the pulmonary arteries to the segmental level. No evidence of pulmonary embolism. The main pulmonary artery is enlarged in caliber. Normal heart size. No significant pericardial effusion. The thoracic aorta is normal in caliber. No atherosclerotic plaque of the thoracic aorta. No coronary artery calcifications. Mediastinum/Nodes: Enlarged right paratracheal lymph node measuring up to 1.6 cm. Borderline enlarged prevascular lymph node measuring 0.9 cm. Prominent but nonenlarged hilar lymph nodes. No axillary lymph nodes. Thyroid gland, trachea, and esophagus demonstrate no significant findings. Lungs/Pleura: Low lung volumes. Bilateral lower lobe subsegmental atelectasis. No focal consolidation. No pulmonary nodule. No pulmonary mass. No pleural effusion. No pneumothorax. Upper Abdomen: No acute abnormality. Musculoskeletal: No chest wall abnormality No suspicious lytic or blastic osseous lesions. No acute displaced fracture. Multilevel mild degenerative changes of the spine. Review of the MIP images confirms the above findings. IMPRESSION: 1. No central or segmental pulmonary embolus. Limited evaluation more distally due to artifact. 2. Low lung volumes with no acute intrathoracic abnormality. Electronically Signed   By: Tish FredericksonMorgane  Naveau M.D.   On: 08/15/2020 05:21    DG Chest Port 1 View  Result Date: 08/15/2020 CLINICAL DATA:  Shortness of breath. EXAM: PORTABLE CHEST 1 VIEW COMPARISON:  Lung bases from abdominal CT 3 days ago 08/12/2020 FINDINGS: Lung volumes are low. Mild elevation of right hemidiaphragm. Mild bibasilar atelectasis. No confluent consolidation. Normal heart size. No pleural fluid or pneumothorax. No acute osseous abnormalities are seen. IMPRESSION: Low lung volumes with mild bibasilar atelectasis. Electronically Signed   By: Narda RutherfordMelanie  Sanford M.D.   On: 08/15/2020 02:05    Pertinent labs & imaging results that were available during my care of the patient were reviewed by me and considered in my medical decision making (see chart for details).  Medications Ordered in ED Medications  cefTRIAXone (ROCEPHIN) 1 g in sodium chloride 0.9 % 100 mL IVPB (1 g Intravenous New Bag/Given (Non-Interop) 08/15/20 0735)  sodium chloride 0.9 % bolus 1,000 mL (0 mLs Intravenous Stopped 08/15/20 0411)  acetaminophen (TYLENOL) tablet 650 mg (650 mg Oral Given 08/15/20 0157)  iohexol (OMNIPAQUE) 350 MG/ML injection 100 mL (80 mLs Intravenous Contrast Given 08/15/20 0500)  albuterol (VENTOLIN HFA) 108 (90 Base) MCG/ACT inhaler 2 puff (2 puffs Inhalation Given 08/15/20 0547)  Procedures Procedures  (including critical care time)  Medical Decision Making / ED Course I have reviewed the nursing notes for this encounter and the patient's prior records (if available in EHR or on provided paperwork).   YOKO MCGAHEE was evaluated in Emergency Department on 08/15/2020 for the symptoms described in the history of present illness. She was evaluated in the context of the global COVID-19 pandemic, which necessitated consideration that the patient might be at risk for infection with the SARS-CoV-2 virus that causes COVID-19. Institutional  protocols and algorithms that pertain to the evaluation of patients at risk for COVID-19 are in a state of rapid change based on information released by regulatory bodies including the CDC and federal and state organizations. These policies and algorithms were followed during the patient's care in the ED.  Patient is febrile, tachycardic and hypoxic on room air Lungs clear to auscultation bilaterally. Currently being treated for a urinary tract infection in the setting of 9 mm stone.  Sepsis labs obtained. Patient placed on 3 L nasal cannula and provided with IV fluids.  Labs were actually reassuring with no leukocytosis or anemia.  No significant electrolyte derangements.  She does have mild AKI.  Lactic acid is negative.  UA without infection; patient's urine culture from several days ago did not grow out any bacteria.  No source for the time being.  Possible viral process.  Covid and influenza were negative. Chest x-ray without pneumonia.  Patient's fever and tachycardia improved after IV fluids and Tylenol.  However she remained hypoxic on room air and requiring supplemental oxygen.  Given this, CTA was obtained to rule out PE and to better characterize lung parenchyma and to assess for possible pneumonia.  CT was reassuring notable only for bibasilar atelectasis and no pneumonia or PEs.  Patient was given incentive spirometer and albuterol puffs and reassessed 45 minutes later still hypoxic.  Not sure what is causing her oxygen demand.  Patient denies any prior pulmonary disease and is not a smoker.  Patient admitted to medicine for further work-up and management.      Final Clinical Impression(s) / ED Diagnoses Final diagnoses:  Fever in adult  Hypoxia      This chart was dictated using voice recognition software.  Despite best efforts to proofread,  errors can occur which can change the documentation meaning.   Nira Conn, MD 08/15/20 [phone]

## 2020-08-15 NOTE — ED Triage Notes (Addendum)
Pt arrives EMS with c/o N/V and chills. Recent dx of kidney stone 4mm this Friday.   EMS administered 4mg  Zofran IVP and NS.   Pt 89-90% on room air. EMS applied 3L [place] with improvement to 95%

## 2020-08-15 NOTE — Sepsis Progress Note (Signed)
s/p OR for cysto and double j- ureteral stent placement.

## 2020-08-15 NOTE — Op Note (Signed)
Operative Note  Preoperative diagnosis:  1.  Left ureteral calculus with concerns for urosepsis  Post operative diagnosis: 1.  Left ureteral calculus with concerns for urosepsis  Procedure(s): 1.  Cystoscopy with left retrograde pyelogram and left ureteral stent placement  Surgeon: Modena Slater, MD  Assistants: None  Anesthesia: General  Complications: None immediate  EBL: Minimal  Specimens: 1.  Urine culture  Drains/Catheters: 1.  6 X 24 double-J ureteral stent  Intraoperative findings: 1.  Normal urethra and bladder 2.  Left retrograde pyelogram revealed a filling defect at the level of the stone with upstream hydroureteronephrosis 3.  Copious amounts of purulent discharge from the left ureter after relief of the obstruction  Indication: 60 year old female with a known left obstructing ureteral calculus presented with fever and hypotension, concern for urosepsis.  Decision was made to proceed urgently to the operating room.  Description of procedure:  The patient was identified and consent was obtained.  The patient was taken to the operating room and placed in the supine position.  The patient was placed under general anesthesia.  Perioperative antibiotics were administered.  The patient was placed in dorsal lithotomy.  Patient was prepped and draped in a standard sterile fashion and a timeout was performed.  A 21 French rigid cystoscope was advanced into the urethra and into the bladder.  The left distal most portion of the ureter was cannulated with an open-ended ureteral catheter.  Retrograde pyelogram was performed with the findings noted above.  A sensor wire was then advanced up to the kidney under fluoroscopic guidance.  A 6 X 24 double-J ureteral stent was advanced up to the kidney under fluoroscopic guidance.  The wire was withdrawn and fluoroscopy confirmed good proximal placement and direct visualization confirmed a good coil within the bladder.  The bladder was  drained and then a urine culture was obtained through the scope and afterwards the scope withdrawn.  This concluded the operation.  Patient tolerated procedure well and was stable postoperatively.  Plan: Continue IV antibiotics until cultures return.

## 2020-08-15 NOTE — ED Notes (Signed)
Pt and family updated on plan of care and planned procedure at 1300 today. Pt verbalizes understanding and verbalizes NPO status

## 2020-08-15 NOTE — Anesthesia Procedure Notes (Signed)
Procedure Name: LMA Insertion Date/Time: 08/15/2020 1:09 PM Performed by: Lorelee Market, CRNA Pre-anesthesia Checklist: Patient identified, Emergency Drugs available, Suction available and Patient being monitored Patient Re-evaluated:Patient Re-evaluated prior to induction Oxygen Delivery Method: Circle system utilized Preoxygenation: Pre-oxygenation with 100% oxygen Induction Type: IV induction LMA: LMA inserted LMA Size: 4.0 Number of attempts: 1 Placement Confirmation: positive ETCO2 and breath sounds checked- equal and bilateral Tube secured with: Tape Dental Injury: Teeth and Oropharynx as per pre-operative assessment

## 2020-08-15 NOTE — Anesthesia Preprocedure Evaluation (Addendum)
Anesthesia Evaluation  Patient identified by MRN, date of birth, ID band Patient awake    Reviewed: Allergy & Precautions, NPO status , Patient's Chart, lab work & pertinent test results  Airway Mallampati: I  TM Distance: >3 FB Neck ROM: Full    Dental  (+) Teeth Intact, Dental Advisory Given   Pulmonary neg pulmonary ROS,    breath sounds clear to auscultation       Cardiovascular negative cardio ROS   Rhythm:Regular Rate:Tachycardia     Neuro/Psych negative neurological ROS  negative psych ROS   GI/Hepatic Neg liver ROS, GERD  ,  Endo/Other  negative endocrine ROS  Renal/GU Renal disease     Musculoskeletal negative musculoskeletal ROS (+)   Abdominal Normal abdominal exam  (+)   Peds  Hematology negative hematology ROS (+)   Anesthesia Other Findings   Reproductive/Obstetrics negative OB ROS                            Anesthesia Physical Anesthesia Plan  ASA: III  Anesthesia Plan: General   Post-op Pain Management:    Induction: Intravenous  PONV Risk Score and Plan: 4 or greater and Ondansetron, Dexamethasone, Midazolam and Scopolamine patch - Pre-op  Airway Management Planned: LMA  Additional Equipment: None  Intra-op Plan:   Post-operative Plan: Extubation in OR  Informed Consent: I have reviewed the patients History and Physical, chart, labs and discussed the procedure including the risks, benefits and alternatives for the proposed anesthesia with the patient or authorized representative who has indicated his/her understanding and acceptance.     Dental advisory given  Plan Discussed with: CRNA  Anesthesia Plan Comments:        Anesthesia Quick Evaluation

## 2020-08-15 NOTE — Sepsis Progress Note (Addendum)
1. Sepsis protocol being followed by [facility]. Lactic acid 1.8 at 0135. Has had 3L fluid resuscitation. Rocephin started prior to code sepsis and continues.

## 2020-08-15 NOTE — ED Notes (Addendum)
MD made aware that if cardiac telemetry is required inpatient- bed request will need to be updated.

## 2020-08-15 NOTE — ED Notes (Addendum)
Pt called out saying that she is shivering again. Upon assessment pt noted to be shivering- Temp 99.3 oral, HR-130, MD made aware. See orders.

## 2020-08-15 NOTE — ED Notes (Signed)
Incentive Spirometer at bedside. Instructing pt on use.

## 2020-08-16 ENCOUNTER — Encounter (HOSPITAL_COMMUNITY): Payer: Self-pay | Admitting: Urology

## 2020-08-16 DIAGNOSIS — R7881 Bacteremia: Secondary | ICD-10-CM | POA: Diagnosis not present

## 2020-08-16 DIAGNOSIS — J9601 Acute respiratory failure with hypoxia: Secondary | ICD-10-CM | POA: Diagnosis not present

## 2020-08-16 DIAGNOSIS — N201 Calculus of ureter: Secondary | ICD-10-CM | POA: Diagnosis not present

## 2020-08-16 DIAGNOSIS — N179 Acute kidney failure, unspecified: Secondary | ICD-10-CM | POA: Diagnosis not present

## 2020-08-16 DIAGNOSIS — Z1612 Extended spectrum beta lactamase (ESBL) resistance: Secondary | ICD-10-CM

## 2020-08-16 DIAGNOSIS — A499 Bacterial infection, unspecified: Secondary | ICD-10-CM | POA: Diagnosis not present

## 2020-08-16 DIAGNOSIS — A419 Sepsis, unspecified organism: Secondary | ICD-10-CM

## 2020-08-16 DIAGNOSIS — B9620 Unspecified Escherichia coli [E. coli] as the cause of diseases classified elsewhere: Secondary | ICD-10-CM

## 2020-08-16 DIAGNOSIS — R0602 Shortness of breath: Secondary | ICD-10-CM

## 2020-08-16 LAB — URINE CULTURE: Culture: NO GROWTH

## 2020-08-16 LAB — BLOOD CULTURE ID PANEL (REFLEXED) - BCID2

## 2020-08-16 LAB — BASIC METABOLIC PANEL
Anion gap: 8 (ref 5–15)
BUN: 23 mg/dL — ABNORMAL HIGH (ref 6–20)
CO2: 26 mmol/L (ref 22–32)
Calcium: 10.7 mg/dL — ABNORMAL HIGH (ref 8.9–10.3)
Chloride: 108 mmol/L (ref 98–111)
Creatinine, Ser: 1.42 mg/dL — ABNORMAL HIGH (ref 0.44–1.00)
GFR, Estimated: 43 mL/min — ABNORMAL LOW (ref >60)
Glucose, Bld: 168 mg/dL — ABNORMAL HIGH (ref 70–99)
Potassium: 5 mmol/L (ref 3.5–5.1)
Sodium: 142 mmol/L (ref 135–145)

## 2020-08-16 LAB — CBC
HCT: 40.6 % (ref 36.0–46.0)
Hemoglobin: 12.7 g/dL (ref 12.0–15.0)
MCH: 28.5 pg (ref 26.0–34.0)
MCHC: 31.3 g/dL (ref 30.0–36.0)
MCV: 91 fL (ref 80.0–100.0)
Platelets: 158 10*3/uL (ref 150–400)
RBC: 4.46 MIL/uL (ref 3.87–5.11)
RDW: 14.2 % (ref 11.5–15.5)
WBC: 12.4 10*3/uL — ABNORMAL HIGH (ref 4.0–10.5)
nRBC: 0 % (ref 0.0–0.2)

## 2020-08-16 MED ORDER — SODIUM CHLORIDE 0.9 % IV SOLN
1.0000 g | Freq: Three times a day (TID) | INTRAVENOUS | Status: DC
  Administered 2020-08-16 – 2020-08-18 (×6): 1 g via INTRAVENOUS
  Filled 2020-08-16 (×7): qty 1

## 2020-08-16 MED ORDER — SODIUM CHLORIDE 0.9 % IV SOLN
1.0000 g | Freq: Two times a day (BID) | INTRAVENOUS | Status: DC
  Administered 2020-08-16: 1 g via INTRAVENOUS
  Filled 2020-08-16: qty 1

## 2020-08-16 NOTE — Consult Note (Signed)
[facility]    Date of Admission:  08/15/2020     Reason for Consult: ESBL bacteremia     Referring Physician: [facility] hospitalist  Current antibiotics: Day 1 meropenem (2/16--present)  Previous antibiotics: Ceftriaxone x 1 dose (2/15)   Principal Problem:   ESBL (extended spectrum beta-lactamase) producing bacteria infection Active Problems:   Shortness of breath   Acute hypoxemic respiratory failure (HCC)   Sepsis (HCC)   Obstruction of left ureteropelvic junction (UPJ) due to stone   AKI (acute kidney injury) (HCC)   E coli bacteremia   ASSESSMENT:    ESBL E. coli bacteremia and sepsis: Secondary to urinary source with obstructing left UPJ stone with moderate hydronephrosis status post stenting 08/1520 Acute kidney injury History of nephrolithiasis  PLAN:    Continue meropenem renally dosed at 1 g every 8 hours Await blood culture susceptibility results Defer to urology regarding stone management but hopefully can be done in the near future while she is on antibiotics Contact precautions due to ESBL  HPI:    Candace Wright is a 60 y.o. female with history of nephrolithiasis and recently diagnosed 9 mm left UPJ stone with moderate left-sided hydronephrosis on 08/12/2020 who presented to the ED 08/15/2020 with acute onset chills, shortness of breath and found to be febrile with concern for sepsis related to urinary tract infection.  She has been dealing with urinary tract infections for the past year and has previously been diagnosed with nephrolithiasis with CT in April showing a 0.8cm left non-obstructing stone, but has not had any intervention given her stones were too small.  Over this past year she has received several courses of antibiotics including a course of IV antibiotics via PICC line in June with Cefotetan.  Last Friday night she had left-sided pain which prompted her to go to the emergency department on Saturday 2/12 where she was  diagnosed with a 9 mm left UPJ stone with moderate left-sided hydronephrosis.  Urine cultures were negative.  She was discharged home with pain meds and a 7-day course of cephalexin.  Prior to this presentation in the ED on Saturday she had also developed mild urinary tract symptoms and self treated herself with a 5-day course of Macrobid.Marland Kitchen  She was evaluated in the urology office on Monday with plans to schedule a lithotripsy.  However, she later developed acute onset symptoms as noted above prompting her to come to the ED.  Her urinalysis was unremarkable, however, was obtained while on antibiotics.  She went to the OR yesterday 2/15 for cystoscopy with left ureteral stent placement.  She will need lithotripsy to treat stone after resolution of infection per urology.  Urine culture from admission is negative.  Admission blood cultures are positive for gram-negative rods with ESBL E. coli detected on BC ID.  Urine culture from stent procedure also positive for greater than 100,000 colonies E. coli.  She has been transitioned from ceftriaxone to meropenem   Past Medical History:  Diagnosis Date  . GERD (gastroesophageal reflux disease)   . Left ureteral stone   . Wears glasses     Social History   Tobacco Use  . Smoking status: Never Smoker  . Smokeless tobacco: Never Used  Substance Use Topics  . Alcohol use: No  . Drug use: No    History reviewed. No pertinent family history.  Allergies  Allergen Reactions  . Ibuprofen Other (See Comments)    "Funny feeling in my head"  Review of Systems  Constitutional: Positive for fever.  HENT: Negative.   Respiratory: Negative.   Cardiovascular: Negative.   Gastrointestinal: Negative for abdominal pain, nausea and vomiting.  Genitourinary: Positive for dysuria and flank pain.  Musculoskeletal: Negative for joint pain.  Skin: Negative.   All other systems reviewed and are negative.   OBJECTIVE:   Blood pressure 118/63, pulse 64,  temperature 98.1 F (36.7 C), temperature source Oral, resp. rate 19, height 5\' 2"  (1.575 m), weight 117.8 kg, SpO2 94 %. Body mass index is 47.5 kg/m.  Physical Exam Constitutional:      General: She is not in acute distress.    Appearance: Normal appearance.  HENT:     Head: Normocephalic and atraumatic.  Eyes:     Extraocular Movements: Extraocular movements intact.     Conjunctiva/sclera: Conjunctivae normal.  Pulmonary:     Effort: Pulmonary effort is normal. No respiratory distress.  Musculoskeletal:     Cervical back: Normal range of motion.     Right lower leg: No edema.     Left lower leg: No edema.  Skin:    General: Skin is warm and dry.     Findings: No erythema or rash.  Neurological:     General: No focal deficit present.     Mental Status: She is alert and oriented to person, place, and time.  Psychiatric:        Mood and Affect: Mood normal.        Behavior: Behavior normal.      Lab Results: Lab Results  Component Value Date   WBC 12.4 (H) 08/16/2020   HGB 12.7 08/16/2020   HCT 40.6 08/16/2020   MCV 91.0 08/16/2020   PLT 158 08/16/2020    Lab Results  Component Value Date   NA 142 08/16/2020   K 5.0 08/16/2020   CO2 26 08/16/2020   GLUCOSE 168 (H) 08/16/2020   BUN 23 (H) 08/16/2020   CREATININE 1.42 (H) 08/16/2020   CALCIUM 10.7 (H) 08/16/2020   GFRNONAA 43 (L) 08/16/2020   GFRAA >60 12/28/2019    Lab Results  Component Value Date   ALT 25 08/15/2020   AST 26 08/15/2020   ALKPHOS 108 08/15/2020   BILITOT 2.8 (H) 08/15/2020   BILITOT 2.5 (H) 08/15/2020    No results found for: CRP  No results found for: ESRSEDRATE  I have reviewed the micro and lab results in Epic.  Imaging: CT Angio Chest PE W and/or Wo Contrast  Result Date: 08/15/2020 CLINICAL DATA:  Left flank pain, shortness of breath, white blood cell 10, negative COVID. Pulmonary embolus suspected. Recent diagnosis of renal stone on 08/12/2020. EXAM: CT ANGIOGRAPHY CHEST  WITH CONTRAST TECHNIQUE: Multidetector CT imaging of the chest was performed using the standard protocol during bolus administration of intravenous contrast. Multiplanar CT image reconstructions and MIPs were obtained to evaluate the vascular anatomy. CONTRAST:  69mL OMNIPAQUE IOHEXOL 350 MG/ML SOLN COMPARISON:  CT renal 08/12/2020, chest x-ray 08/15/2020 FINDINGS: Cardiovascular: Satisfactory opacification of the pulmonary arteries to the segmental level. No evidence of pulmonary embolism. The main pulmonary artery is enlarged in caliber. Normal heart size. No significant pericardial effusion. The thoracic aorta is normal in caliber. No atherosclerotic plaque of the thoracic aorta. No coronary artery calcifications. Mediastinum/Nodes: Enlarged right paratracheal lymph node measuring up to 1.6 cm. Borderline enlarged prevascular lymph node measuring 0.9 cm. Prominent but nonenlarged hilar lymph nodes. No axillary lymph nodes. Thyroid gland, trachea, and esophagus demonstrate no significant findings.  Lungs/Pleura: Low lung volumes. Bilateral lower lobe subsegmental atelectasis. No focal consolidation. No pulmonary nodule. No pulmonary mass. No pleural effusion. No pneumothorax. Upper Abdomen: No acute abnormality. Musculoskeletal: No chest wall abnormality No suspicious lytic or blastic osseous lesions. No acute displaced fracture. Multilevel mild degenerative changes of the spine. Review of the MIP images confirms the above findings. IMPRESSION: 1. No central or segmental pulmonary embolus. Limited evaluation more distally due to artifact. 2. Low lung volumes with no acute intrathoracic abnormality. Electronically Signed   By: Tish Frederickson M.D.   On: 08/15/2020 05:21   DG Chest Port 1 View  Result Date: 08/15/2020 CLINICAL DATA:  Shortness of breath. EXAM: PORTABLE CHEST 1 VIEW COMPARISON:  Lung bases from abdominal CT 3 days ago 08/12/2020 FINDINGS: Lung volumes are low. Mild elevation of right  hemidiaphragm. Mild bibasilar atelectasis. No confluent consolidation. Normal heart size. No pleural fluid or pneumothorax. No acute osseous abnormalities are seen. IMPRESSION: Low lung volumes with mild bibasilar atelectasis. Electronically Signed   By: Narda Rutherford M.D.   On: 08/15/2020 02:05   DG C-Arm 1-60 Min-No Report  Result Date: 08/15/2020 Fluoroscopy was utilized by the requesting physician.  No radiographic interpretation.     Imaging independently reviewed in Epic.  Vedia Coffer for Infectious [facility] [phone] pager 08/16/2020, 1:12 PM

## 2020-08-16 NOTE — Progress Notes (Signed)
PHARMACY NOTE:  ANTIMICROBIAL RENAL DOSAGE ADJUSTMENT  Current antimicrobial regimen includes a mismatch between antimicrobial dosage and estimated renal function. As per policy approved by the Pharmacy & Therapeutics and Medical Executive Committees, the antimicrobial dosage will be adjusted accordingly.  Current antimicrobial and dosage:  Meropenem 1 g q12 hr  Indication: ESBL bacteremia  Renal Function:   Estimated Creatinine Clearance: 52.6 mL/min (A) (by C-G formula based on SCr of 1.42 mg/dL (H)). []      On intermittent HD, scheduled: []      On CRRT    Antimicrobial dosage has been changed to:  1g q8 hr   Additional Comments: n/a   Thank you for allowing pharmacy to be a part of this patient's care.  , PharmD, BCPS [phone] 08/16/2020, 8:34 AM

## 2020-08-16 NOTE — Plan of Care (Signed)

## 2020-08-16 NOTE — Progress Notes (Signed)
PHARMACY - PHYSICIAN COMMUNICATION CRITICAL VALUE ALERT - BLOOD CULTURE IDENTIFICATION (BCID)  Candace Wright is an 60 y.o. female who presented to [facility] on 08/15/2020 with a chief complaint of flank pain, UTI.  Assessment:   Admit with sepsis due to obstructing left UPJ stone. S/p stent placement 2/15. Blood cx growing Ecoli, ESBL detected.   Scr increased 1.82- NCrCl ~ 20ml/min  Name of physician (or Provider) Contacted: Reyes Ivan NP  Current antibiotics: Rocephin 1gm IV q24h  Changes to prescribed antibiotics recommended:  Change antibiotics to Meropenem 1gm IV q12h Recommendations accepted by provider  Results for orders placed or performed during the hospital encounter of 08/15/20  Blood Culture ID Panel (Reflexed) (Collected: 08/15/2020  8:25 AM)  Result Value Ref Range   Enterococcus faecalis NOT DETECTED NOT DETECTED   Enterococcus Faecium NOT DETECTED NOT DETECTED   Listeria monocytogenes NOT DETECTED NOT DETECTED   Staphylococcus species NOT DETECTED NOT DETECTED   Staphylococcus aureus (BCID) NOT DETECTED NOT DETECTED   Staphylococcus epidermidis NOT DETECTED NOT DETECTED   Staphylococcus lugdunensis NOT DETECTED NOT DETECTED   Streptococcus species NOT DETECTED NOT DETECTED   Streptococcus agalactiae NOT DETECTED NOT DETECTED   Streptococcus pneumoniae NOT DETECTED NOT DETECTED   Streptococcus pyogenes NOT DETECTED NOT DETECTED   A.calcoaceticus-baumannii NOT DETECTED NOT DETECTED   Bacteroides fragilis NOT DETECTED NOT DETECTED   Enterobacterales DETECTED (A) NOT DETECTED   Enterobacter cloacae complex NOT DETECTED NOT DETECTED   Escherichia coli DETECTED (A) NOT DETECTED   Klebsiella aerogenes NOT DETECTED NOT DETECTED   Klebsiella oxytoca NOT DETECTED NOT DETECTED   Klebsiella pneumoniae NOT DETECTED NOT DETECTED   Proteus species NOT DETECTED NOT DETECTED   Salmonella species NOT DETECTED NOT DETECTED   Serratia marcescens NOT DETECTED NOT DETECTED    Haemophilus influenzae NOT DETECTED NOT DETECTED   Neisseria meningitidis NOT DETECTED NOT DETECTED   Pseudomonas aeruginosa NOT DETECTED NOT DETECTED   Stenotrophomonas maltophilia NOT DETECTED NOT DETECTED   Candida albicans NOT DETECTED NOT DETECTED   Candida auris NOT DETECTED NOT DETECTED   Candida glabrata NOT DETECTED NOT DETECTED   Candida krusei NOT DETECTED NOT DETECTED   Candida parapsilosis NOT DETECTED NOT DETECTED   Candida tropicalis NOT DETECTED NOT DETECTED   Cryptococcus neoformans/gattii NOT DETECTED NOT DETECTED   CTX-M ESBL DETECTED (A) NOT DETECTED   Carbapenem resistance IMP NOT DETECTED NOT DETECTED   Carbapenem resistance KPC NOT DETECTED NOT DETECTED   Carbapenem resistance NDM NOT DETECTED NOT DETECTED   Carbapenem resist OXA 48 LIKE NOT DETECTED NOT DETECTED   Carbapenem resistance VIM NOT DETECTED NOT DETECTED    Junita Push PharmD 08/16/2020  5:24 AM

## 2020-08-16 NOTE — Progress Notes (Signed)
PROGRESS NOTE    Patient: Candace Wright                 PCP: Maurice Small, MD                    DOB: January 23, 1961            DOA: 08/15/2020 HFW:[id number]             DOS: 08/16/2020, 7:38 AM   LOS: 1 day   Date of Service: The patient was seen and examined on 08/16/2020  Subjective:   The patient was seen and examined this morning. Stable at this time. POD #1 s/p Cystoscopy with left retrograde pyelogram and left ureteral stent placement Stable this morning satting 94% on room air Still complaining of  Otherwise no issues overnight .  Brief Narrative:   60 year old female with a history of nephrolithiasis and recently diagnosed 9 mm left UPJ stone  with moderate left sided hydronephrosis on 08/12/2020 who presented to the ED with acute onset chills and tachypnea/shortness of breath .  Patient states that she has been dealing with UTIs for the past year and has previously been diagnosed with nephrolithiasis but has not had any interventions as these were small stones. States that Friday night she had left sided pain which prompted her to go to the ED on Saturday and was diagnosed with a 9 mm left UPJ stone with moderate left-sided hydro.  [facility] (Monday) and plans to schedule a lithotripsy, however this was not scheduled yet. Last night she had acute onset symptoms as above prompting her to come to the ED.  Has been sitting around most of the day for the past several days. Shortness of breath improved with O2 by EMS. No history of tobacco use, asthma or COPD or DVT/PE.   ED Course:  Dennie Bible was Febrile, tachycardic, tachypneic,  hemodynamically stable, SpO2 dropped to 87% with ambulation on room air and was placed on 2 to 3 L/min O2.  Labs: Sodium 137, K4.0, BUN 19, creatinine 1.6, lactic acid 1.8, WBC 10.0, Hb 14.3, platelets 204, UA unremarkable. COVID-19 and flu negative. Imaging: CXR-low lung volumes with mild bibasilar atelectasis. CTA  chest-low lung volumes with no acute intrathoracic abnormality.  Patient received ceftriaxone, 1 L NS bolus, albuterol and Tylenol.   Assessment & Plan:   Principal Problem:   Acute hypoxemic respiratory failure (HCC) Active Problems:   Shortness of breath   Sepsis (HCC)   Obstruction of left ureteropelvic junction (UPJ) due to stone   Elevated serum creatinine   1. Sepsis without septic shock suspect secondary to obstructing left UPJ stone with hydronephrosis -bacteremia Enterobacter/E. Coli (blood cx fron 2/15) a. Sepsis criteria: Fever, tachycardia, tachypnea -symptoms is improved b. Continue IV fluid boluses per sepsis protocol -BP stabilized c. UA negative, though infection is likely obstructed by stone d. Blood cultures -- Enterobacter/E. Coli (blood cx fron 2/15) e. Urine culture - >100 K colonies of E. coli f. IV ceftriaxone --- changed to meropenem g. Urology consulted, with Dr. Alvester Morin. h. POD #1 s/p Cystoscopy with left retrograde pyelogram and left ureteral stent placement i. Continue current  Abx,  IV Meropenem till culture sensitivity finalized  2. Acute hypoxic respiratory failure, suspect secondary to atelectasis a. SpO2 intermittently drops to high 80s low 90s on room air --- much improved b. Atelectasis on CXR and rales on exam c. Incentive spirometry d. Stable  this morning satting 94% room air  3. Elevated creatinine a. Creatinine 1.6 >> 1.82 >>1.42 today  b.  baseline 0.8 on June 2021  4. Elevated T billi a. AST/ALT unremarkable b. Check fractionated bilirubin  5. Abnormal CT chest a. Incidental enlarged right paratracheal lymph node measuring 1.6 cm noted - Discussed with Dr. 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   ------------------------------------------------------------------------------------------------------------------------------------ Cultures; Blood Cultures 1/2/  >> on 08/15/2020   Enterobacter and E. coli Urine Culture  >>> E. Coli > 100 k Colonies    Antimicrobials: IV Rocephin  >> IV meropenem   Consultants: Urologist ID    ------------------------------------------------------------------------------------------------------------------------------------ DVT prophylaxis:  Heparin SQ Code Status:   Code Status: Full Code  Family Communication:  Husband present at bedside The above findings and plan of care has been discussed with patient (her husband)  in detail,  they expressed understanding and agreement of above. -Advance care planning has been discussed.   Admission status:   Status is: Inpatient  Remains inpatient appropriate because:Inpatient level of care appropriate due to severity of illness   Dispo: The patient is from: Home              Anticipated d/c is to: Home              Anticipated d/c date is: 3 days              Patient currently is not medically stable to d/c.   Difficult to place patient No      Level of care: Telemetry   Procedures:     08/15/2020 - Cystoscopy with left retrograde pyelogram and left ureteral stent placement  Antimicrobials:  Anti-infectives (From admission, onward)   Start     Dose/Rate Route Frequency Ordered Stop   08/16/20 0600  cefTRIAXone (ROCEPHIN) 1 g in sodium chloride 0.9 % 100 mL IVPB  Status:  Discontinued        1 g 200 mL/hr over 30 Minutes Intravenous Every 24 hours 08/15/20 0758 08/16/20 0523   08/16/20 0600  meropenem (MERREM) 1 g in sodium chloride 0.9 % 100 mL IVPB        1 g 200 mL/hr over 30 Minutes Intravenous Every 12 hours 08/16/20 0523     08/15/20 0715  cefTRIAXone (ROCEPHIN) 1 g in sodium chloride 0.9 % 100 mL IVPB        1 g 200 mL/hr over 30 Minutes Intravenous  Once 08/15/20 0704 08/15/20 0805       Medication:  . heparin  5,000 Units Subcutaneous Q8H    acetaminophen **OR** acetaminophen, HYDROcodone-acetaminophen, metoprolol tartrate, morphine injection,  ondansetron **OR** ondansetron (ZOFRAN) IV   Objective:   Vitals:   08/15/20 1547 08/15/20 2017 08/16/20 0010 08/16/20 0422  BP: (!) 104/56 (!) 111/47 (!) 114/58 118/63  Pulse: 93 80 61 64  Resp: Temp: 99.1 F (37.3 C) 98.2 F (36.8 C) 97.6 F (36.4 C) 98.1 F (36.7 C)  TempSrc: Oral Oral Oral Oral  SpO2: 97% 95% 94% 94%  Weight: 117.8 kg     Height:  (1.575 m)       Intake/Output Summary (Last 24 hours) at 08/16/2020 0738 Last data filed at 08/16/2020 0600 Gross per 24 hour  Intake 2332.84 ml  Output 1275 ml  Net 1057.84 ml   Filed Weights   08/15/20 1547  Weight: 117.8 kg     Examination:   Physical  Exam  Constitution:  Alert, cooperative, no distress,  Appears calm and comfortable  Psychiatric: Normal and stable mood and affect, cognition intact,   HEENT: Normocephalic, PERRL, otherwise with in Normal limits  Chest:Chest symmetric Cardio vascular:  S1/S2, RRR, No murmure, No Rubs or Gallops  pulmonary: Clear to auscultation bilaterally, respirations unlabored, negative wheezes / crackles Abdomen: Soft, non-tender, non-distended, bowel sounds,no masses, no organomegaly Muscular skeletal: Limited exam - in bed, able to move all 4 extremities, Normal strength,  Neuro: CNII-XII intact. , normal motor and sensation, reflexes intact  Extremities: No pitting edema lower extremities, +2 pulses  Skin: Dry, warm to touch, negative for any Rashes, No open wounds Wounds: per nursing documentation    ------------------------------------------------------------------------------------------------------------------------------------------    LABs:  CBC Latest Ref Rng & Units 08/16/2020 08/15/2020 08/15/2020  WBC 4.0 - 10.5 K/uL 12.4(H) 3.0(L) -  Hemoglobin 12.0 - 15.0 g/dL 16.112.7 16.0(H) 14.3  Hematocrit 36.0 - 46.0 % 40.6 50.6(H) 42.0  Platelets 150 - 400 K/uL 158 192 -   CMP Latest Ref Rng & Units 08/16/2020 08/15/2020 08/15/2020  Glucose 70 - 99 mg/dL  096(E168(H) - 454(U132(H)  BUN 6 - 20 mg/dL 98(J23(H) - 18  Creatinine 0.44 - 1.00 mg/dL 1.91(Y1.42(H) - 7.82(N1.82(H)  Sodium 135 - 145 mmol/L 142 - 139  Potassium 3.5 - 5.1 mmol/L 5.0 - 4.7  Chloride 98 - 111 mmol/L 108 - 103  CO2 22 - 32 mmol/L 26 - 22  Calcium 8.9 - 10.3 mg/dL 10.7(H) - 10.9(H)  Total Protein 6.5 - 8.1 g/dL - - 7.8  Total Bilirubin 0.3 - 1.2 mg/dL - 2.5(H) 2.8(H)  Alkaline Phos 38 - 126 U/L - - 108  AST 15 - 41 U/L - - 26  ALT 0 - 44 U/L - - 25       Micro Results Recent Results (from the past 240 hour(s))  Urine culture     Status: None   Collection Time: 08/12/20  9:23 PM   Specimen: Urine, Clean Catch  Result Value Ref Range Status   Specimen Description   Final    URINE, CLEAN CATCH Performed at [facility] [place], [address] [place], [place] [address]    Special Requests   Final    NONE Performed at [facility], [address] [place], [place] [address]    Culture   Final    NO GROWTH Performed at [facility], [address] [place], [place] [address]    Report Status 08/14/2020 FINAL  Final  Resp Panel by RT-PCR (Flu A&B, Covid) Nasopharyngeal Swab     Status: None   Collection Time: 08/15/20  1:58 AM   Specimen: Nasopharyngeal Swab; Nasopharyngeal(NP) swabs in vial transport medium  Result Value Ref Range Status   SARS Coronavirus 2 by RT PCR NEGATIVE NEGATIVE Final    Comment: (NOTE) SARS-CoV-2 target nucleic acids are NOT DETECTED.  The SARS-CoV-2 RNA is generally detectable in upper respiratory specimens during the acute phase of infection. The lowest concentration of SARS-CoV-2 viral copies this assay can detect is 138 copies/mL. A negative result does not preclude SARS-Cov-2 infection and should not be used as the sole basis for treatment or other patient management decisions. A negative result may occur with  improper specimen collection/handling, submission of specimen other than nasopharyngeal swab, presence of viral  mutation(s) within the areas targeted by this assay, and inadequate number of viral copies(<138 copies/mL). A negative result must be combined with clinical  observations, patient history, and epidemiological information. The expected result is Negative.  Fact Sheet for Patients:  BloggerCourse.com  Fact Sheet for Healthcare Providers:  SeriousBroker.it  This test is no t yet approved or cleared by the [place] FDA and  has been authorized for detection and/or diagnosis of SARS-CoV-2 by FDA under an Emergency Use Authorization (EUA). This EUA will remain  in effect (meaning this test can be used) for the duration of the COVID-19 declaration under Section 564(b)(1) of the Act, 21 U.S.C.section 360bbb-3(b)(1), unless the authorization is terminated  or revoked sooner.       Influenza A by PCR NEGATIVE NEGATIVE Final   Influenza B by PCR NEGATIVE NEGATIVE Final    Comment: (NOTE) The Xpert Xpress SARS-CoV-2/FLU/RSV plus assay is intended as an aid in the diagnosis of influenza from Nasopharyngeal swab specimens and should not be used as a sole basis for treatment. Nasal washings and aspirates are unacceptable for Xpert Xpress SARS-CoV-2/FLU/RSV testing.  Fact Sheet for Patients: BloggerCourse.com  Fact Sheet for Healthcare Providers: SeriousBroker.it  This test is not yet approved or cleared by the [place] FDA and has been authorized for detection and/or diagnosis of SARS-CoV-2 by FDA under an Emergency Use Authorization (EUA). This EUA will remain in effect (meaning this test can be used) for the duration of the COVID-19 declaration under Section 564(b)(1) of the Act, 21 U.S.C. section 360bbb-3(b)(1), unless the authorization is terminated or revoked.  Performed at [facility], [address], [place], [place] [address]   Culture, blood (x 2)      Status: None (Preliminary result)   Collection Time: 08/15/20  8:25 AM   Specimen: BLOOD  Result Value Ref Range Status   Specimen Description   Final    BLOOD RIGHT ANTECUBITAL Performed at [facility], [address], [place], [place] [address]    Special Requests   Final    BOTTLES DRAWN AEROBIC AND ANAEROBIC Blood Culture adequate volume Performed at [facility], [address] [place], [place] [address]    Culture  Setup Time   Final    ANAEROBIC BOTTLE ONLY GRAM NEGATIVE RODS Organism ID to follow CRITICAL RESULT CALLED TO, READ BACK BY AND VERIFIED WITH: Damaris Hippo [facility] 08/16/20 0454 JDW Performed at [facility], [address] [place], [place] [address]    Culture PENDING  Incomplete   Report Status PENDING  Incomplete  Blood Culture ID Panel (Reflexed)     Status: Abnormal   Collection Time: 08/15/20  8:25 AM  Result Value Ref Range Status   Enterococcus faecalis NOT DETECTED NOT DETECTED Final   Enterococcus Faecium NOT DETECTED NOT DETECTED Final   Listeria monocytogenes NOT DETECTED NOT DETECTED Final   Staphylococcus species NOT DETECTED NOT DETECTED Final   Staphylococcus aureus (BCID) NOT DETECTED NOT DETECTED Final   Staphylococcus epidermidis NOT DETECTED NOT DETECTED Final   Staphylococcus lugdunensis NOT DETECTED NOT DETECTED Final   Streptococcus species NOT DETECTED NOT DETECTED Final   Streptococcus agalactiae NOT DETECTED NOT DETECTED Final   Streptococcus pneumoniae NOT DETECTED NOT DETECTED Final   Streptococcus pyogenes NOT DETECTED NOT DETECTED Final   A.calcoaceticus-baumannii NOT DETECTED NOT DETECTED Final   Bacteroides fragilis NOT DETECTED NOT DETECTED Final   Enterobacterales DETECTED (A) NOT DETECTED Final    Comment: Enterobacterales represent a large order of gram negative bacteria, not a single organism. CRITICAL RESULT CALLED TO, READ BACK BY AND VERIFIED WITH: Damaris Hippo  [facility] 08/16/20  0454 JDW    Enterobacter cloacae complex NOT DETECTED NOT DETECTED Final   Escherichia coli DETECTED (A) NOT DETECTED Final    Comment: CRITICAL RESULT CALLED TO, READ BACK BY AND VERIFIED WITH: Damaris Hippo [facility] 08/16/20 0454 JDW    Klebsiella aerogenes NOT DETECTED NOT DETECTED Final   Klebsiella oxytoca NOT DETECTED NOT DETECTED Final   Klebsiella pneumoniae NOT DETECTED NOT DETECTED Final   Proteus species NOT DETECTED NOT DETECTED Final   Salmonella species NOT DETECTED NOT DETECTED Final   Serratia marcescens NOT DETECTED NOT DETECTED Final   Haemophilus influenzae NOT DETECTED NOT DETECTED Final   Neisseria meningitidis NOT DETECTED NOT DETECTED Final   Pseudomonas aeruginosa NOT DETECTED NOT DETECTED Final   Stenotrophomonas maltophilia NOT DETECTED NOT DETECTED Final   Candida albicans NOT DETECTED NOT DETECTED Final   Candida auris NOT DETECTED NOT DETECTED Final   Candida glabrata NOT DETECTED NOT DETECTED Final   Candida krusei NOT DETECTED NOT DETECTED Final   Candida parapsilosis NOT DETECTED NOT DETECTED Final   Candida tropicalis NOT DETECTED NOT DETECTED Final   Cryptococcus neoformans/gattii NOT DETECTED NOT DETECTED Final   CTX-M ESBL DETECTED (A) NOT DETECTED Final    Comment: CRITICAL RESULT CALLED TO, READ BACK BY AND VERIFIED WITH: Damaris Hippo [facility] 08/16/20 0454 JDW (NOTE) Extended spectrum beta-lactamase detected. Recommend a carbapenem as initial therapy.      Carbapenem resistance IMP NOT DETECTED NOT DETECTED Final   Carbapenem resistance KPC NOT DETECTED NOT DETECTED Final   Carbapenem resistance NDM NOT DETECTED NOT DETECTED Final   Carbapenem resist OXA 48 LIKE NOT DETECTED NOT DETECTED Final   Carbapenem resistance VIM NOT DETECTED NOT DETECTED Final    Comment: Performed at [facility], [address], [place], [place] [address]    Radiology Reports CT Angio Chest PE W and/or Wo Contrast  Result Date: 08/15/2020 CLINICAL DATA:  Left  flank pain, shortness of breath, white blood cell 10, negative COVID. Pulmonary embolus suspected. Recent diagnosis of renal stone on 08/12/2020. EXAM: CT ANGIOGRAPHY CHEST WITH CONTRAST TECHNIQUE: Multidetector CT imaging of the chest was performed using the standard protocol during bolus administration of intravenous contrast. Multiplanar CT image reconstructions and MIPs were obtained to evaluate the vascular anatomy. CONTRAST:  80mL OMNIPAQUE IOHEXOL 350 MG/ML SOLN COMPARISON:  CT renal 08/12/2020, chest x-ray 08/15/2020 FINDINGS: Cardiovascular: Satisfactory opacification of the pulmonary arteries to the segmental level. No evidence of pulmonary embolism. The main pulmonary artery is enlarged in caliber. Normal heart size. No significant pericardial effusion. The thoracic aorta is normal in caliber. No atherosclerotic plaque of the thoracic aorta. No coronary artery calcifications. Mediastinum/Nodes: Enlarged right paratracheal lymph node measuring up to 1.6 cm. Borderline enlarged prevascular lymph node measuring 0.9 cm. Prominent but nonenlarged hilar lymph nodes. No axillary lymph nodes. Thyroid gland, trachea, and esophagus demonstrate no significant findings. Lungs/Pleura: Low lung volumes. Bilateral lower lobe subsegmental atelectasis. No focal consolidation. No pulmonary nodule. No pulmonary mass. No pleural effusion. No pneumothorax. Upper Abdomen: No acute abnormality. Musculoskeletal: No chest wall abnormality No suspicious lytic or blastic osseous lesions. No acute displaced fracture. Multilevel mild degenerative changes of the spine. Review of the MIP images confirms the above findings. IMPRESSION: 1. No central or segmental pulmonary embolus. Limited evaluation more distally due to artifact. 2. Low lung volumes with no acute intrathoracic abnormality. Electronically Signed   By: Tish Frederickson M.D.   On: 08/15/2020 05:21   DG Chest Port 1  View  Result Date: 08/15/2020 CLINICAL DATA:  Shortness  of breath. EXAM: PORTABLE CHEST 1 VIEW COMPARISON:  Lung bases from abdominal CT 3 days ago 08/12/2020 FINDINGS: Lung volumes are low. Mild elevation of right hemidiaphragm. Mild bibasilar atelectasis. No confluent consolidation. Normal heart size. No pleural fluid or pneumothorax. No acute osseous abnormalities are seen. IMPRESSION: Low lung volumes with mild bibasilar atelectasis. Electronically Signed   By: Narda Rutherford M.D.   On: 08/15/2020 02:05   DG C-Arm 1-60 Min-No Report  Result Date: 08/15/2020 Fluoroscopy was utilized by the requesting physician.  No radiographic interpretation.   CT Renal Stone Study  Result Date: 08/12/2020 CLINICAL DATA:  History of kidney stones with left-sided flank pain. EXAM: CT ABDOMEN AND PELVIS WITHOUT CONTRAST TECHNIQUE: Multidetector CT imaging of the abdomen and pelvis was performed following the standard protocol without IV contrast. COMPARISON:  CT dated October 05, 2019 FINDINGS: Lower chest: There is atelectasis at the right lung base.The heart size is normal. Hepatobiliary: There is a pattern megaly with hepatic steatosis. The hepatic dome is not entirely visualized on this study. There are 2 small lesions in the liver that are stable from prior study and likely represent cysts. The gallbladder is unremarkable. Pancreas: Normal contours without ductal dilatation. No peripancreatic fluid collection. Spleen: Unremarkable. Adrenals/Urinary Tract: --Adrenal glands: Unremarkable. --Right kidney/ureter: No hydronephrosis or radiopaque kidney stones. --Left kidney/ureter: There is moderate left-sided hydronephrosis secondary to a stone measuring approximately 9 mm at the left UPJ. There is an additional nonobstructing stone in the lower pole measuring approximately 4 mm. --Urinary bladder: Unremarkable. Stomach/Bowel: --Stomach/Duodenum: No hiatal hernia or other gastric abnormality. Normal duodenal course and caliber. --Small bowel: Unremarkable. --Colon:  Unremarkable. --Appendix: Normal. Vascular/Lymphatic: Normal course and caliber of the major abdominal vessels. --No retroperitoneal lymphadenopathy. --No mesenteric lymphadenopathy. --No pelvic or inguinal lymphadenopathy. Reproductive: Unremarkable Other: No ascites or free air. The abdominal wall is normal. Musculoskeletal. No acute displaced fractures. IMPRESSION: 1. Moderate left-sided hydronephrosis secondary to a 9 mm stone at the left UPJ. 2. Additional nonobstructing stone in the lower pole of the left kidney measuring approximately 4 mm. 3. Hepatomegaly with hepatic steatosis. Electronically Signed   By: Katherine Mantle M.D.   On: 08/12/2020 23:38    SIGNED: Kendell Bane, MD, FHM. [facility],  Pager (please use [email or web address] to page/text) Please use Epic Secure Chat for non-urgent communication (7AM-7PM)  If 7PM-7AM, please contact night-coverage [email or web address], 08/16/2020, 7:38 AM

## 2020-08-17 ENCOUNTER — Inpatient Hospital Stay: Payer: Self-pay

## 2020-08-17 DIAGNOSIS — R7881 Bacteremia: Secondary | ICD-10-CM | POA: Diagnosis not present

## 2020-08-17 DIAGNOSIS — N179 Acute kidney failure, unspecified: Secondary | ICD-10-CM | POA: Diagnosis not present

## 2020-08-17 DIAGNOSIS — A499 Bacterial infection, unspecified: Secondary | ICD-10-CM | POA: Diagnosis not present

## 2020-08-17 DIAGNOSIS — Z1612 Extended spectrum beta lactamase (ESBL) resistance: Secondary | ICD-10-CM | POA: Diagnosis not present

## 2020-08-17 DIAGNOSIS — N201 Calculus of ureter: Secondary | ICD-10-CM | POA: Diagnosis not present

## 2020-08-17 LAB — BASIC METABOLIC PANEL
Anion gap: 8 (ref 5–15)
BUN: 28 mg/dL — ABNORMAL HIGH (ref 6–20)
CO2: 24 mmol/L (ref 22–32)
Calcium: 10.3 mg/dL (ref 8.9–10.3)
Chloride: 107 mmol/L (ref 98–111)
Creatinine, Ser: 1.06 mg/dL — ABNORMAL HIGH (ref 0.44–1.00)
GFR, Estimated: >60 mL/min (ref >60)
Glucose, Bld: 151 mg/dL — ABNORMAL HIGH (ref 70–99)
Potassium: 4.6 mmol/L (ref 3.5–5.1)
Sodium: 139 mmol/L (ref 135–145)

## 2020-08-17 LAB — CBC
HCT: 40.8 % (ref 36.0–46.0)
Hemoglobin: 13.1 g/dL (ref 12.0–15.0)
MCH: 29 pg (ref 26.0–34.0)
MCHC: 32.1 g/dL (ref 30.0–36.0)
MCV: 90.3 fL (ref 80.0–100.0)
Platelets: 194 10*3/uL (ref 150–400)
RBC: 4.52 MIL/uL (ref 3.87–5.11)
RDW: 14 % (ref 11.5–15.5)
WBC: 16.9 10*3/uL — ABNORMAL HIGH (ref 4.0–10.5)
nRBC: 0 % (ref 0.0–0.2)

## 2020-08-17 LAB — URINE CULTURE: Culture: >=100000 — AB

## 2020-08-17 MED ORDER — SODIUM CHLORIDE 0.9 % IV SOLN
INTRAVENOUS | Status: DC | PRN
  Administered 2020-08-17: 250 mL via INTRAVENOUS

## 2020-08-17 NOTE — TOC Progression Note (Signed)
Transition of Care [facility]) - Progression Note    Patient Details  Name: Candace Wright MRN: [id number] Date of Birth: [birth year]  [facility]) CM/SW Contact  Myonna Chisom, Olegario Messier, RN Phone Number: 08/17/2020, 3:55 PM  Clinical Narrative:  [facility] rep Jeri Modena following for home iv abx initial instruction;[facility] for HHRN-further instruction;labs. fpr PIC in am. D/c home w/iv abx. Patient/spouse in agreement.     Expected Discharge Plan: Home w Home Health Services Barriers to Discharge: Continued Medical Work up  Expected Discharge Plan and Services Expected Discharge Plan: Home w Home Health Services   Discharge Planning Services: CM Consult Post Acute Care Choice: Home Health Living arrangements for the past 2 months: Single Family Home                           HH Arranged: RN,IV Antibiotics HH Agency: [facility] Date HH Agency Contacted: 08/17/20 Time HH Agency Contacted: 1547 Representative spoke with at [facility] Agency: Pam   Social Determinants of Health (SDOH) Interventions    Readmission Risk Interventions No flowsheet data found.

## 2020-08-17 NOTE — Progress Notes (Signed)
°   ° °  [facility]  Date of Admission:  08/15/2020           Reason for visit: Follow up on ESBL bacteremia  Current antibiotics: Day 2 meropenem (2/16--present)  Previous antibiotics: Ceftriaxone x 1 dose (2/15)    ASSESSMENT:    ESBL E. coli bacteremia and sepsis: Secondary to urinary source with obstructing left UPJ stone with moderate hydronephrosis status post stenting 08/15/2020.  Blood cultures were repeated yesterday and currently no growth to date.  She is on meropenem Leukocytosis: Secondary to above Acute kidney injury: Improving History of nephrolithiasis  PLAN:    Continue meropenem Follow-up blood culture susceptibility results and repeated blood cultures We will defer timing of stone management to urology, but hopefully can be done in the near future while she is on antibiotics Contact precautions given ESBL  SUBJECTIVE:   24 hour events:  Afebrile Improving renal function Rising leukocytosis Repeat blood cultures no growth to date Urine culture with ESBL E. coli  No new complaints.  No fevers or chills.  Tolerating meropenem.   OBJECTIVE:   Blood pressure 123/68, pulse (!) 53, temperature 98 F (36.7 C), temperature source Oral, resp. rate 18, height 5\' 2"  (1.575 m), weight 117.8 kg, SpO2 95 %. Body mass index is 47.5 kg/m.  Physical Exam Constitutional:      General: She is not in acute distress.    Appearance: Normal appearance.  HENT:     Head: Normocephalic and atraumatic.  Neurological:     General: No focal deficit present.     Mental Status: She is alert and oriented to person, place, and time.  Psychiatric:        Mood and Affect: Mood normal.        Behavior: Behavior normal.      Lab Results: Lab Results  Component Value Date   WBC 16.9 (H) 08/17/2020   HGB 13.1 08/17/2020   HCT 40.8 08/17/2020   MCV 90.3 08/17/2020   PLT 194 08/17/2020    Lab Results  Component Value Date   NA 139 08/17/2020    K 4.6 08/17/2020   CO2 24 08/17/2020   GLUCOSE 151 (H) 08/17/2020   BUN 28 (H) 08/17/2020   CREATININE 1.06 (H) 08/17/2020   CALCIUM 10.3 08/17/2020   GFRNONAA >60 08/17/2020   GFRAA >60 12/28/2019    Lab Results  Component Value Date   ALT 25 08/15/2020   AST 26 08/15/2020   ALKPHOS 108 08/15/2020   BILITOT 2.8 (H) 08/15/2020   BILITOT 2.5 (H) 08/15/2020    No results found for: CRP  No results found for: ESRSEDRATE   I have reviewed the micro and lab results in Epic.  Imaging: DG C-Arm 1-60 Min-No Report  Result Date: 08/15/2020 Fluoroscopy was utilized by the requesting physician.  No radiographic interpretation.     Imaging independently reviewed in Epic.    08/17/2020 for Infectious Disease [facility] [phone] pager 08/17/2020, 8:50 AM

## 2020-08-17 NOTE — Progress Notes (Signed)
PROGRESS NOTE    Patient: Candace Wright                 PCP: Maurice Small, MD                    DOB: [birth year]            DOA: 08/15/2020 [id number]             DOS: 08/17/2020, 9:11 AM   LOS: 2 days   Date of Service: The patient was seen and examined on 08/17/2020  Subjective:   The patient was seen and examined this morning, stable no acute distress. POD #2 s/p Cystoscopy with left retrograde pyelogram and left ureteral stent placement Hemodynamics stable afebrile normotensive Discuss the finding of cultures in detail  Brief Narrative:   60 year old female with a history of nephrolithiasis and recently diagnosed 9 mm left UPJ stone  with moderate left sided hydronephrosis on 08/12/2020 who presented to the ED with acute onset chills and tachypnea/shortness of breath .  Patient states that she has been dealing with UTIs for the past year and has previously been diagnosed with nephrolithiasis but has not had any interventions as these were small stones. States that Friday night she had left sided pain which prompted her to go to the ED on Saturday and was diagnosed with a 9 mm left UPJ stone with moderate left-sided hydro.  [facility] (Monday) and plans to schedule a lithotripsy, however this was not scheduled yet. Last night she had acute onset symptoms as above prompting her to come to the ED.  Has been sitting around most of the day for the past several days. Shortness of breath improved with O2 by EMS. No history of tobacco use, asthma or COPD or DVT/PE.   ED Course:  Dennie Bible was Febrile, tachycardic, tachypneic,  hemodynamically stable, SpO2 dropped to 87% with ambulation on room air and was placed on 2 to 3 L/min O2.  Labs: Sodium 137, K4.0, BUN 19, creatinine 1.6, lactic acid 1.8, WBC 10.0, Hb 14.3, platelets 204, UA unremarkable. COVID-19 and flu negative. Imaging: CXR-low lung volumes with mild bibasilar atelectasis. CTA chest-low  lung volumes with no acute intrathoracic abnormality.  Patient received ceftriaxone, 1 L NS bolus, albuterol and Tylenol.   Assessment & Plan:   Principal Problem:   ESBL (extended spectrum beta-lactamase) producing bacteria infection Active Problems:   Shortness of breath   Acute hypoxemic respiratory failure (HCC)   Sepsis (HCC)   Obstruction of left ureteropelvic junction (UPJ) due to stone   AKI (acute kidney injury) (HCC)   E coli bacteremia   1. Sepsis without septic shock suspect secondary to obstructing left UPJ stone with hydronephrosis - ESBL bacteremia Enterobacter/E. Coli (blood cx fron 2/15) a. Sepsis criteria: Fever, tachycardia, tachypnea -symptoms is improved b. Continue IV fluid boluses per sepsis protocol -BP stabilized c. UA negative, though infection is likely obstructed by stone d. Blood cultures -- Enterobacter/E. Coli (blood cx fron 2/15) e. Urine culture - >100 K colonies of E. coli f. IV ceftriaxone --- changed to meropenem g. Urology consulted, with Dr. Alvester Morin. h. POD #2 s/p Cystoscopy with left retrograde pyelogram and left ureteral stent placement i. Continue current  Abx,  IV Meropenem till culture sensitivity finalized j. ID consulted recommended to continue meropenem 1 g every 8 hours k. Repeating blood cultures 08/17/2020  contact precautions  2. Acute hypoxic respiratory failure,  suspect secondary to atelectasis a. SpO2 intermittently drops to high 80s low 90s on room air --- improved, stable b. Atelectasis on CXR and rales on exam c. Incentive spirometry d. Stable this morning satting 94% room air  3. Elevated creatinine a. Creatinine 1.6 >> 1.82 >>1.42  >> 1.06 today  b.  baseline 0.8 on June 2021 c. Monitoring d. Avoiding nephrotoxins  4. Elevated T billi a. AST/ALT unremarkable b. Direct bilirubin 0.8, indirect bili Ruben 1.7, total bilirubin 2.8  5. Abnormal CT chest a. Incidental enlarged right paratracheal lymph node measuring  1.6 cm noted - Discussed with Dr. 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   ------------------------------------------------------------------------------------------------------------------------------------ Cultures; Blood Cultures 1/2   >> on 08/15/2020  Enterobacter and E. Coli Repeat blood cultures 08/17/2020 Urine Culture  >>> E. Coli > 100 k Colonies    Antimicrobials: IV Rocephin  >> IV meropenem   Consultants: Urologist / ID    ------------------------------------------------------------------------------------------------------------------------------------ DVT prophylaxis:  Heparin SQ Code Status:   Code Status: Full Code  Family Communication: Discussed with patient in detail today -husband was present 2/16  Admission status:   Status is: Inpatient  Remains inpatient appropriate because:Inpatient level of care appropriate due to severity of illness   Dispo: The patient is from: Home              Anticipated d/c is to: Home              Anticipated d/c date is: 3 days              Patient currently is not medically stable to d/c.   Difficult to place patient No      Level of care: Telemetry   Procedures:     08/15/2020 - Cystoscopy with left retrograde pyelogram and left ureteral stent placement  Antimicrobials:  Anti-infectives (From admission, onward)   Start     Dose/Rate Route Frequency Ordered Stop   08/16/20 1400  meropenem (MERREM) 1 g in sodium chloride 0.9 % 100 mL IVPB        1 g 200 mL/hr over 30 Minutes Intravenous Every 8 hours 08/16/20 0834     08/16/20 0600  cefTRIAXone (ROCEPHIN) 1 g in sodium chloride 0.9 % 100 mL IVPB  Status:  Discontinued        1 g 200 mL/hr over 30 Minutes Intravenous Every 24 hours 08/15/20 0758 08/16/20 0523   08/16/20 0600  meropenem (MERREM) 1 g in sodium chloride 0.9 % 100 mL IVPB  Status:  Discontinued        1 g 200 mL/hr over 30 Minutes Intravenous Every 12  hours 08/16/20 0523 08/16/20 0834   08/15/20 0715  cefTRIAXone (ROCEPHIN) 1 g in sodium chloride 0.9 % 100 mL IVPB        1 g 200 mL/hr over 30 Minutes Intravenous  Once 08/15/20 0704 08/15/20 0805       Medication:  . heparin  5,000 Units Subcutaneous Q8H    acetaminophen **OR** acetaminophen, HYDROcodone-acetaminophen, metoprolol tartrate, morphine injection, ondansetron **OR** ondansetron (ZOFRAN) IV   Objective:   Vitals:   08/16/20 0422 08/16/20 1315 08/16/20 2109 08/17/20 0527  BP: 118/63 135/76 132/61 123/68  Pulse: 64 61 67 (!) 53  Resp: 19 18 20 18   Temp: 98.1 F (36.7 C) 98 F (36.7 C) 98.4 F (36.9 C) 98 F (36.7 C)  TempSrc: Oral Oral Oral Oral  SpO2: 94% 98% 95% 95%  Weight:  Height:        Intake/Output Summary (Last 24 hours) at 08/17/2020 0911 Last data filed at 08/17/2020 0600 Gross per 24 hour  Intake 1020 ml  Output 700 ml  Net 320 ml   Filed Weights   08/15/20 1547  Weight: 117.8 kg     Examination:     Physical Exam:   General:  Alert, oriented, cooperative, no distress;   HEENT:  Normocephalic, PERRL, otherwise with in Normal limits   Neuro:  CNII-XII intact. , normal motor and sensation, reflexes intact   Lungs:   Clear to auscultation BL, Respirations unlabored, no wheezes / crackles  Cardio:    S1/S2, RRR, No murmure, No Rubs or Gallops   Abdomen:   Soft, non-tender, bowel sounds active all four quadrants,  no guarding or peritoneal signs.  Muscular skeletal:  Limited exam - in bed, able to move all 4 extremities, Normal strength,  2+ pulses,  symmetric, No pitting edema  Skin:  Dry, warm to touch, negative for any Rashes,  Wounds: Please see nursing documentation           LABs:  CBC Latest Ref Rng & Units 08/17/2020 08/16/2020 08/15/2020  WBC 4.0 - 10.5 K/uL 16.9(H) 12.4(H) 3.0(L)  Hemoglobin 12.0 - 15.0 g/dL 09.8 11.9 16.0(H)  Hematocrit 36.0 - 46.0 % 40.8 40.6 50.6(H)  Platelets 150 - 400 K/uL 194 158 192   CMP  Latest Ref Rng & Units 08/17/2020 08/16/2020 08/15/2020  Glucose 70 - 99 mg/dL 147(W) 295(A) -  BUN 6 - 20 mg/dL 21(H) 08(M) -  Creatinine 0.44 - 1.00 mg/dL 5.78(I) 6.96(E) -  Sodium 135 - 145 mmol/L 139 142 -  Potassium 3.5 - 5.1 mmol/L 4.6 5.0 -  Chloride 98 - 111 mmol/L 107 108 -  CO2 22 - 32 mmol/L 24 26 -  Calcium 8.9 - 10.3 mg/dL 95.2 10.7(H) -  Total Protein 6.5 - 8.1 g/dL - - -  Total Bilirubin 0.3 - 1.2 mg/dL - - 2.5(H)  Alkaline Phos 38 - 126 U/L - - -  AST 15 - 41 U/L - - -  ALT 0 - 44 U/L - - -       Micro Results Recent Results (from the past 240 hour(s))  Urine culture     Status: None   Collection Time: 08/12/20  9:23 PM   Specimen: Urine, Clean Catch  Result Value Ref Range Status   Specimen Description   Final    URINE, CLEAN CATCH Performed at [facility], [address] [place], [place] [address]    Special Requests   Final    NONE Performed at [facility], [address] [place], [place] [address]    Culture   Final    NO GROWTH Performed at [facility], [address], [place], [place] [address]    Report Status 08/14/2020 FINAL  Final  Blood culture (routine single)     Status: None (Preliminary result)   Collection Time: 08/15/20  1:35 AM   Specimen: BLOOD LEFT HAND  Result Value Ref Range Status   Specimen Description   Final    BLOOD LEFT HAND Performed at [facility], [address] [place], [place] [address]    Special Requests   Final    BOTTLES DRAWN AEROBIC AND ANAEROBIC Blood Culture results may not be optimal due to an inadequate volume of blood received in culture bottles Performed at [facility], [address] [place], [place] [address]    Culture   Final    NO GROWTH 2 DAYS Performed at [facility], [address], [place], [place] [address]    Report Status PENDING  Incomplete  Resp Panel by RT-PCR (Flu A&B, Covid) Nasopharyngeal Swab     Status:  None   Collection Time: 08/15/20  1:58 AM   Specimen: Nasopharyngeal Swab; Nasopharyngeal(NP) swabs in vial transport medium  Result Value Ref Range Status   SARS Coronavirus 2 by RT PCR NEGATIVE NEGATIVE Final    Comment: (NOTE) SARS-CoV-2 target nucleic acids are NOT DETECTED.  The SARS-CoV-2 RNA is generally detectable in upper respiratory specimens during the acute phase of infection. The lowest concentration of SARS-CoV-2 viral copies this assay can detect is 138 copies/mL. A negative result does not preclude SARS-Cov-2 infection and should not be used as the sole basis for treatment or other patient management decisions. A negative result may occur with  improper specimen collection/handling, submission of specimen other than nasopharyngeal swab, presence of viral mutation(s) within the areas targeted by this assay, and inadequate number of viral copies(<138 copies/mL). A negative result must be combined with clinical observations, patient history, and epidemiological information. The expected result is Negative.  Fact Sheet for Patients:  BloggerCourse.com  Fact Sheet for Healthcare Providers:  SeriousBroker.it  This test is no t yet approved or cleared by the [place] FDA and  has been authorized for detection and/or diagnosis of SARS-CoV-2 by FDA under an Emergency Use Authorization (EUA). This EUA will remain  in effect (meaning this test can be used) for the duration of the COVID-19 declaration under Section 564(b)(1) of the Act, 21 U.S.C.section 360bbb-3(b)(1), unless the authorization is terminated  or revoked sooner.       Influenza A by PCR NEGATIVE NEGATIVE Final   Influenza B by PCR NEGATIVE NEGATIVE Final    Comment: (NOTE) The Xpert Xpress SARS-CoV-2/FLU/RSV plus assay is intended as an aid in the diagnosis of influenza from Nasopharyngeal swab specimens and should not be used as a sole basis for  treatment. Nasal washings and aspirates are unacceptable for Xpert Xpress SARS-CoV-2/FLU/RSV testing.  Fact Sheet for Patients: BloggerCourse.com  Fact Sheet for Healthcare Providers: SeriousBroker.it  This test is not yet approved or cleared by the [place] FDA and has been authorized for detection and/or diagnosis of SARS-CoV-2 by FDA under an Emergency Use Authorization (EUA). This EUA will remain in effect (meaning this test can be used) for the duration of the COVID-19 declaration under Section 564(b)(1) of the Act, 21 U.S.C. section 360bbb-3(b)(1), unless the authorization is terminated or revoked.  Performed at [facility], [address], [place], [place] [address]   Urine culture     Status: None   Collection Time: 08/15/20  3:20 AM   Specimen: In/Out Cath Urine  Result Value Ref Range Status   Specimen Description   Final    IN/OUT CATH URINE Performed at [facility], [address], [place], [place] [address]    Special Requests   Final    NONE Performed at [facility], [address], [place], [place] [address]    Culture   Final    NO GROWTH Performed at [facility], [address] [place], [place] [address]    Report Status 08/16/2020 FINAL  Final  Culture, blood (x 2)     Status: None (Preliminary result)  Collection Time: 08/15/20  8:25 AM   Specimen: BLOOD  Result Value Ref Range Status   Specimen Description   Final    BLOOD LEFT ANTECUBITAL Performed at [facility], [address], [place], [place] [address]    Special Requests   Final    BOTTLES DRAWN AEROBIC AND ANAEROBIC Blood Culture adequate volume Performed at [facility], [address] [place], [place] [address]    Culture  Setup Time   Final    GRAM NEGATIVE RODS ANAEROBIC BOTTLE ONLY CRITICAL VALUE NOTED.  VALUE IS  CONSISTENT WITH PREVIOUSLY REPORTED AND CALLED VALUE. Performed at [facility], [address], [place], [place] [address]    Culture GRAM NEGATIVE RODS  Final   Report Status PENDING  Incomplete  Culture, blood (x 2)     Status: None (Preliminary result)   Collection Time: 08/15/20  8:25 AM   Specimen: BLOOD  Result Value Ref Range Status   Specimen Description   Final    BLOOD RIGHT ANTECUBITAL Performed at [facility], [address], [place], [place] [address]    Special Requests   Final    BOTTLES DRAWN AEROBIC AND ANAEROBIC Blood Culture adequate volume Performed at [facility], [address], [place], [place] [address]    Culture  Setup Time   Final    ANAEROBIC BOTTLE ONLY GRAM NEGATIVE RODS Organism ID to follow CRITICAL RESULT CALLED TO, READ BACK BY AND VERIFIED WITH: Damaris Hippo [facility] 08/16/20 0454 JDW    Culture   Final    NO GROWTH 2 DAYS Performed at [facility], [address] [place], [place] [address]    Report Status PENDING  Incomplete  Blood Culture ID Panel (Reflexed)     Status: Abnormal   Collection Time: 08/15/20  8:25 AM  Result Value Ref Range Status   Enterococcus faecalis NOT DETECTED NOT DETECTED Final   Enterococcus Faecium NOT DETECTED NOT DETECTED Final   Listeria monocytogenes NOT DETECTED NOT DETECTED Final   Staphylococcus species NOT DETECTED NOT DETECTED Final   Staphylococcus aureus (BCID) NOT DETECTED NOT DETECTED Final   Staphylococcus epidermidis NOT DETECTED NOT DETECTED Final   Staphylococcus lugdunensis NOT DETECTED NOT DETECTED Final   Streptococcus species NOT DETECTED NOT DETECTED Final   Streptococcus agalactiae NOT DETECTED NOT DETECTED Final   Streptococcus pneumoniae NOT DETECTED NOT DETECTED Final   Streptococcus pyogenes NOT DETECTED NOT DETECTED Final   A.calcoaceticus-baumannii NOT DETECTED NOT DETECTED Final   Bacteroides fragilis NOT DETECTED NOT DETECTED Final    Enterobacterales DETECTED (A) NOT DETECTED Final    Comment: Enterobacterales represent a large order of gram negative bacteria, not a single organism. CRITICAL RESULT CALLED TO, READ BACK BY AND VERIFIED WITH: Damaris Hippo [facility] 08/16/20 0454 JDW    Enterobacter cloacae complex NOT DETECTED NOT DETECTED Final   Escherichia coli DETECTED (A) NOT DETECTED Final    Comment: CRITICAL RESULT CALLED TO, READ BACK BY AND VERIFIED WITH: Damaris Hippo [facility] 08/16/20 0454 JDW    Klebsiella aerogenes NOT DETECTED NOT DETECTED Final   Klebsiella oxytoca NOT DETECTED NOT DETECTED Final   Klebsiella pneumoniae NOT DETECTED NOT DETECTED Final   Proteus species NOT DETECTED NOT DETECTED Final   Salmonella species NOT DETECTED NOT DETECTED Final   Serratia marcescens NOT DETECTED NOT DETECTED Final   Haemophilus influenzae NOT DETECTED NOT DETECTED Final   Neisseria meningitidis NOT DETECTED NOT DETECTED Final  Pseudomonas aeruginosa NOT DETECTED NOT DETECTED Final   Stenotrophomonas maltophilia NOT DETECTED NOT DETECTED Final   Candida albicans NOT DETECTED NOT DETECTED Final   Candida auris NOT DETECTED NOT DETECTED Final   Candida glabrata NOT DETECTED NOT DETECTED Final   Candida krusei NOT DETECTED NOT DETECTED Final   Candida parapsilosis NOT DETECTED NOT DETECTED Final   Candida tropicalis NOT DETECTED NOT DETECTED Final   Cryptococcus neoformans/gattii NOT DETECTED NOT DETECTED Final   CTX-M ESBL DETECTED (A) NOT DETECTED Final    Comment: CRITICAL RESULT CALLED TO, READ BACK BY AND VERIFIED WITH: Damaris Hippo [facility] 08/16/20 0454 JDW (NOTE) Extended spectrum beta-lactamase detected. Recommend a carbapenem as initial therapy.      Carbapenem resistance IMP NOT DETECTED NOT DETECTED Final   Carbapenem resistance KPC NOT DETECTED NOT DETECTED Final   Carbapenem resistance NDM NOT DETECTED NOT DETECTED Final   Carbapenem resist OXA 48 LIKE NOT DETECTED NOT DETECTED Final   Carbapenem  resistance VIM NOT DETECTED NOT DETECTED Final    Comment: Performed at [facility], [address] [place], [place] [address]  Urine Culture     Status: Abnormal   Collection Time: 08/15/20  1:29 PM   Specimen: PATH Cytology Urine  Result Value Ref Range Status   Specimen Description   Final    URINE, RANDOM Performed at [facility], [address], [place], [place] [address]    Special Requests   Final    NONE Performed at [facility], [address], [place], [place] [address]    Culture (A)  Final    >=100,000 COLONIES/mL ESCHERICHIA COLI Confirmed Extended Spectrum Beta-Lactamase Producer (ESBL).  In bloodstream infections from ESBL organisms, carbapenems are preferred over piperacillin/tazobactam. They are shown to have a lower risk of mortality.    Report Status 08/17/2020 FINAL  Final   Organism ID, Bacteria ESCHERICHIA COLI (A)  Final      Susceptibility   Escherichia coli - MIC*    AMPICILLIN >=32 RESISTANT Resistant     CEFAZOLIN >=64 RESISTANT Resistant     CEFEPIME 1 SENSITIVE Sensitive     CEFTRIAXONE 32 RESISTANT Resistant     CIPROFLOXACIN >=4 RESISTANT Resistant     GENTAMICIN <=1 SENSITIVE Sensitive     IMIPENEM <=0.25 SENSITIVE Sensitive     NITROFURANTOIN 128 RESISTANT Resistant     TRIMETH/SULFA >=320 RESISTANT Resistant     AMPICILLIN/SULBACTAM >=32 RESISTANT Resistant     PIP/TAZO 8 SENSITIVE Sensitive     * >=100,000 COLONIES/mL ESCHERICHIA COLI  Culture, blood (routine x 2)     Status: None (Preliminary result)   Collection Time: 08/16/20  6:35 PM   Specimen: BLOOD  Result Value Ref Range Status   Specimen Description   Final    BLOOD LEFT ANTECUBITAL Performed at [facility], [address], [place], [place] [address]    Special Requests   Final    BOTTLES DRAWN AEROBIC AND ANAEROBIC Blood Culture adequate volume Performed at [facility], [address],  [place], [place] [address]    Culture   Final    NO GROWTH < 12 HOURS Performed at [facility], [address], [place], [place] [address]    Report Status PENDING  Incomplete  Culture, blood (routine x 2)     Status: None (Preliminary result)   Collection Time: 08/16/20  6:35 PM   Specimen: BLOOD  Result Value Ref Range Status  Specimen Description   Final    BLOOD RIGHT ANTECUBITAL Performed at [facility], [address], [place], [place] [address]    Special Requests   Final    BOTTLES DRAWN AEROBIC AND ANAEROBIC Blood Culture adequate volume Performed at [facility], [address] [place], [place] [address]    Culture   Final    NO GROWTH < 12 HOURS Performed at [facility], [address], [place], [place] [address]    Report Status PENDING  Incomplete    Radiology Reports CT Angio Chest PE W and/or Wo Contrast  Result Date: 08/15/2020 CLINICAL DATA:  Left flank pain, shortness of breath, white blood cell 10, negative COVID. Pulmonary embolus suspected. Recent diagnosis of renal stone on 08/12/2020. EXAM: CT ANGIOGRAPHY CHEST WITH CONTRAST TECHNIQUE: Multidetector CT imaging of the chest was performed using the standard protocol during bolus administration of intravenous contrast. Multiplanar CT image reconstructions and MIPs were obtained to evaluate the vascular anatomy. CONTRAST:  27mL OMNIPAQUE IOHEXOL 350 MG/ML SOLN COMPARISON:  CT renal 08/12/2020, chest x-ray 08/15/2020 FINDINGS: Cardiovascular: Satisfactory opacification of the pulmonary arteries to the segmental level. No evidence of pulmonary embolism. The main pulmonary artery is enlarged in caliber. Normal heart size. No significant pericardial effusion. The thoracic aorta is normal in caliber. No atherosclerotic plaque of the thoracic aorta. No coronary artery calcifications. Mediastinum/Nodes: Enlarged right paratracheal lymph node measuring up to 1.6 cm. Borderline  enlarged prevascular lymph node measuring 0.9 cm. Prominent but nonenlarged hilar lymph nodes. No axillary lymph nodes. Thyroid gland, trachea, and esophagus demonstrate no significant findings. Lungs/Pleura: Low lung volumes. Bilateral lower lobe subsegmental atelectasis. No focal consolidation. No pulmonary nodule. No pulmonary mass. No pleural effusion. No pneumothorax. Upper Abdomen: No acute abnormality. Musculoskeletal: No chest wall abnormality No suspicious lytic or blastic osseous lesions. No acute displaced fracture. Multilevel mild degenerative changes of the spine. Review of the MIP images confirms the above findings. IMPRESSION: 1. No central or segmental pulmonary embolus. Limited evaluation more distally due to artifact. 2. Low lung volumes with no acute intrathoracic abnormality. Electronically Signed   By: Tish Frederickson M.D.   On: 08/15/2020 05:21   DG Chest Port 1 View  Result Date: 08/15/2020 CLINICAL DATA:  Shortness of breath. EXAM: PORTABLE CHEST 1 VIEW COMPARISON:  Lung bases from abdominal CT 3 days ago 08/12/2020 FINDINGS: Lung volumes are low. Mild elevation of right hemidiaphragm. Mild bibasilar atelectasis. No confluent consolidation. Normal heart size. No pleural fluid or pneumothorax. No acute osseous abnormalities are seen. IMPRESSION: Low lung volumes with mild bibasilar atelectasis. Electronically Signed   By: Narda Rutherford M.D.   On: 08/15/2020 02:05   DG C-Arm 1-60 Min-No Report  Result Date: 08/15/2020 Fluoroscopy was utilized by the requesting physician.  No radiographic interpretation.   CT Renal Stone Study  Result Date: 08/12/2020 CLINICAL DATA:  History of kidney stones with left-sided flank pain. EXAM: CT ABDOMEN AND PELVIS WITHOUT CONTRAST TECHNIQUE: Multidetector CT imaging of the abdomen and pelvis was performed following the standard protocol without IV contrast. COMPARISON:  CT dated October 05, 2019 FINDINGS: Lower chest: There is atelectasis at the right  lung base.The heart size is normal. Hepatobiliary: There is a pattern megaly with hepatic steatosis. The hepatic dome is not entirely visualized on this study. There are 2 small lesions in the liver that are stable from prior study and likely represent cysts. The gallbladder is unremarkable. Pancreas: Normal contours without ductal dilatation. No  peripancreatic fluid collection. Spleen: Unremarkable. Adrenals/Urinary Tract: --Adrenal glands: Unremarkable. --Right kidney/ureter: No hydronephrosis or radiopaque kidney stones. --Left kidney/ureter: There is moderate left-sided hydronephrosis secondary to a stone measuring approximately 9 mm at the left UPJ. There is an additional nonobstructing stone in the lower pole measuring approximately 4 mm. --Urinary bladder: Unremarkable. Stomach/Bowel: --Stomach/Duodenum: No hiatal hernia or other gastric abnormality. Normal duodenal course and caliber. --Small bowel: Unremarkable. --Colon: Unremarkable. --Appendix: Normal. Vascular/Lymphatic: Normal course and caliber of the major abdominal vessels. --No retroperitoneal lymphadenopathy. --No mesenteric lymphadenopathy. --No pelvic or inguinal lymphadenopathy. Reproductive: Unremarkable Other: No ascites or free air. The abdominal wall is normal. Musculoskeletal. No acute displaced fractures. IMPRESSION: 1. Moderate left-sided hydronephrosis secondary to a 9 mm stone at the left UPJ. 2. Additional nonobstructing stone in the lower pole of the left kidney measuring approximately 4 mm. 3. Hepatomegaly with hepatic steatosis. Electronically Signed   By: Katherine Mantle M.D.   On: 08/12/2020 23:38    SIGNED: Kendell Bane, MD, FHM. [facility],  Pager (please use [email or web address] to page/text) Please use Epic Secure Chat for non-urgent communication (7AM-7PM)  If 7PM-7AM, please contact night-coverage [email or web address], 08/17/2020, 9:11 AM

## 2020-08-18 DIAGNOSIS — A499 Bacterial infection, unspecified: Secondary | ICD-10-CM | POA: Diagnosis not present

## 2020-08-18 DIAGNOSIS — R7881 Bacteremia: Secondary | ICD-10-CM | POA: Diagnosis not present

## 2020-08-18 DIAGNOSIS — N179 Acute kidney failure, unspecified: Secondary | ICD-10-CM | POA: Diagnosis not present

## 2020-08-18 DIAGNOSIS — B9620 Unspecified Escherichia coli [E. coli] as the cause of diseases classified elsewhere: Secondary | ICD-10-CM | POA: Diagnosis not present

## 2020-08-18 DIAGNOSIS — Z1612 Extended spectrum beta lactamase (ESBL) resistance: Secondary | ICD-10-CM | POA: Diagnosis not present

## 2020-08-18 LAB — CULTURE, BLOOD (ROUTINE X 2): Special Requests: ADEQUATE

## 2020-08-18 LAB — BASIC METABOLIC PANEL
Anion gap: 6 (ref 5–15)
BUN: 26 mg/dL — ABNORMAL HIGH (ref 6–20)
CO2: 29 mmol/L (ref 22–32)
Calcium: 10.5 mg/dL — ABNORMAL HIGH (ref 8.9–10.3)
Chloride: 107 mmol/L (ref 98–111)
Creatinine, Ser: 1.11 mg/dL — ABNORMAL HIGH (ref 0.44–1.00)
GFR, Estimated: 58 mL/min — ABNORMAL LOW (ref >60)
Glucose, Bld: 109 mg/dL — ABNORMAL HIGH (ref 70–99)
Potassium: 4.5 mmol/L (ref 3.5–5.1)
Sodium: 142 mmol/L (ref 135–145)

## 2020-08-18 LAB — CBC
HCT: 45.4 % (ref 36.0–46.0)
Hemoglobin: 14.1 g/dL (ref 12.0–15.0)
MCH: 28.1 pg (ref 26.0–34.0)
MCHC: 31.1 g/dL (ref 30.0–36.0)
MCV: 90.6 fL (ref 80.0–100.0)
Platelets: 223 10*3/uL (ref 150–400)
RBC: 5.01 MIL/uL (ref 3.87–5.11)
RDW: 14.1 % (ref 11.5–15.5)
WBC: 14.4 10*3/uL — ABNORMAL HIGH (ref 4.0–10.5)
nRBC: 0 % (ref 0.0–0.2)

## 2020-08-18 MED ORDER — RISAQUAD PO CAPS: 2.0000 | ORAL_CAPSULE | Freq: Three times a day (TID) | ORAL | Status: DC

## 2020-08-18 MED ORDER — SODIUM CHLORIDE 0.9 % IV SOLN
1.0000 g | INTRAVENOUS | Status: DC
  Administered 2020-08-18: 1000 mg via INTRAVENOUS
  Filled 2020-08-18: qty 1

## 2020-08-18 MED ORDER — BACID PO TABS: 2.0000 | ORAL_TABLET | Freq: Three times a day (TID) | ORAL | 0 refills | Status: AC

## 2020-08-18 MED ORDER — SODIUM CHLORIDE 0.9% FLUSH: 10.0000 mL | INTRAVENOUS | Status: DC | PRN

## 2020-08-18 MED ORDER — ONDANSETRON HCL 4 MG PO TABS: 4.0000 mg | ORAL_TABLET | Freq: Four times a day (QID) | ORAL | 0 refills | Status: DC | PRN

## 2020-08-18 MED ORDER — ERTAPENEM IV (FOR PTA / DISCHARGE USE ONLY): 1.0000 g | INTRAVENOUS | 0 refills | Status: AC

## 2020-08-18 MED ORDER — ACETAMINOPHEN 325 MG PO TABS: 650.0000 mg | ORAL_TABLET | Freq: Four times a day (QID) | ORAL | 0 refills | Status: AC | PRN

## 2020-08-18 MED ORDER — HEPARIN SOD (PORK) LOCK FLUSH 100 UNIT/ML IV SOLN
250.0000 [IU] | INTRAVENOUS | Status: AC | PRN
  Administered 2020-08-18: 250 [IU]

## 2020-08-18 MED ORDER — CHLORHEXIDINE GLUCONATE CLOTH 2 % EX PADS: 6.0000 | MEDICATED_PAD | Freq: Every day | CUTANEOUS | Status: DC

## 2020-08-18 NOTE — Progress Notes (Signed)
Peripherally Inserted Central Catheter Placement  The IV Nurse has discussed with the patient and/or persons authorized to consent for the patient, the purpose of this procedure and the potential benefits and risks involved with this procedure.  The benefits include less needle sticks, lab draws from the catheter, and the patient may be discharged home with the catheter. Risks include, but not limited to, infection, bleeding, blood clot (thrombus formation), and puncture of an artery; nerve damage and irregular heartbeat and possibility to perform a PICC exchange if needed/ordered by physician.  Alternatives to this procedure were also discussed.  Bard Power PICC patient education guide, fact sheet on infection prevention and patient information card has been provided to patient /or left at bedside.    PICC Placement Documentation  PICC Single Lumen 08/18/20 PICC Right Brachial 36 cm 0 cm (Active)  Indication for Insertion or Continuance of Line Home intravenous therapies (PICC only) 08/18/20 1128  Exposed Catheter (cm) 0 cm 08/18/20 1128  Site Assessment Clean;Dry;Intact 08/18/20 1128  Line Status Flushed;Saline locked;Blood return noted 08/18/20 1128  Dressing Type Transparent;Securing device 08/18/20 1128  Dressing Status Clean;Dry;Intact 08/18/20 1128  Antimicrobial disc in place? Yes 08/18/20 1128  Safety Lock Not Applicable 08/18/20 1128  Dressing Change Due 08/25/20 08/18/20 1128       [name] 08/18/2020, 11:30 AM

## 2020-08-18 NOTE — Progress Notes (Signed)
PROGRESS NOTE    Patient: Candace Wright                 PCP: Maurice Small, MD                    DOB: 1960-12-17            DOA: 08/15/2020 [id number]             DOS: 08/18/2020, 9:17 AM   LOS: 3 days   Date of Service: The patient was seen and examined on 08/18/2020  Subjective:   The patient was seen and examined this morning, remained stable afebrile normotensive Husband present at bedside POD #3 s/p Cystoscopy with left retrograde pyelogram and left ureteral stent placement  Discuss the finding of cultures in detail >> due to bacteremia nature ESBL--needing prolonged IV antibiotics, pending PICC line placement  Brief Narrative:   60 year old female with a history of nephrolithiasis and recently diagnosed 9 mm left UPJ stone  with moderate left sided hydronephrosis on 08/12/2020 who presented to the ED with acute onset chills and tachypnea/shortness of breath .  Patient states that she has been dealing with UTIs for the past year and has previously been diagnosed with nephrolithiasis but has not had any interventions as these were small stones. States that Friday night she had left sided pain which prompted her to go to the ED on Saturday and was diagnosed with a 9 mm left UPJ stone with moderate left-sided hydro.  [facility]) and plans to schedule a lithotripsy, however this was not scheduled yet. Last night she had acute onset symptoms as above prompting her to come to the ED.  Has been sitting around most of the day for the past several days. Shortness of breath improved with O2 by EMS. No history of tobacco use, asthma or COPD or DVT/PE.   ED Course:  Dennie Bible was Febrile, tachycardic, tachypneic,  hemodynamically stable, SpO2 dropped to 87% with ambulation on room air and was placed on 2 to 3 L/min O2.  Labs: Sodium 137, K4.0, BUN 19, creatinine 1.6, lactic acid 1.8, WBC 10.0, Hb 14.3, platelets 204, UA unremarkable. COVID-19  and flu negative. Imaging: CXR-low lung volumes with mild bibasilar atelectasis. CTA chest-low lung volumes with no acute intrathoracic abnormality.  Patient received ceftriaxone, 1 L NS bolus, albuterol and Tylenol.   Assessment & Plan:   Principal Problem:   ESBL (extended spectrum beta-lactamase) producing bacteria infection Active Problems:   Shortness of breath   Acute hypoxemic respiratory failure (HCC)   Sepsis (HCC)   Obstruction of left ureteropelvic junction (UPJ) due to stone   AKI (acute kidney injury) (HCC)   E coli bacteremia   1. Sepsis without septic shock suspect secondary to obstructing left UPJ stone with hydronephrosis - ESBL bacteremia Enterobacter/E. Coli (blood cx fron 2/15) a. Sepsis criteria: Fever, tachycardia, tachypnea -resolved b. s/p IV fluid boluses per sepsis protocol -BP stabilized c. Blood cultures -- Enterobacter/E. Coli (blood cx fron 2/15) d. Urine culture - >100 K colonies of E. coli e. IV ceftriaxone --- changed to IV  Meropenem f. Urology consulted, with Dr. Alvester Morin. g. POD #3 s/p Cystoscopy with left retrograde pyelogram and left ureteral stent placement h. Continue current  Abx,  IV Meropenem till culture sensitivity finalized i. ID consulted recommended to continue meropenem 1 g every 8 hours j. Repeating blood cultures 08/17/2020  contact precautions  2.  Acute hypoxic respiratory failure, suspect secondary to atelectasis a. SpO2 intermittently drops to high 80s low 90s on room air -resolved b. Atelectasis on CXR and rales on exam c. Incentive spirometry  d. Satting 95% on room air  3. Elevated creatinine a. Creatinine 1.6 >> 1.82 >>1.42  >> 1.06 >> 1.11 today  b.  baseline 0.8 on June 2021 c. Monitoring d. Avoiding nephrotoxins  4. Elevated T billi a. AST/ALT unremarkable b. Direct bilirubin 0.8, indirect bili Ruben 1.7, total bilirubin 2.8  5. Abnormal CT chest a. Incidental enlarged right paratracheal lymph node measuring  1.6 cm noted - Discussed with Dr. 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   ------------------------------------------------------------------------------------------------------------------------------------ Cultures; Blood Cultures 1/2   >> on 08/15/2020  Enterobacter and E. Coli Repeat blood cultures 08/17/2020 Urine Culture  >>> E. Coli > 100 k Colonies    Antimicrobials: IV Rocephin  >> IV meropenem   Consultants: Urologist / ID    ------------------------------------------------------------------------------------------------------------------------------------ DVT prophylaxis:  Heparin SQ Code Status:   Code Status: Full Code  Family Communication: Discussed with patient and her husband at bedside  Admission status:   Status is: Inpatient  Remains inpatient appropriate because:Inpatient level of care appropriate due to severity of illness   Dispo: The patient is from: Home              Anticipated d/c is to: Home w [facility]               Anticipated d/c date is: on 1-2 days with home health, PICC line, prolonged IV antibiotic  therapy              Patient currently is not medically stable to d/c.   Difficult to place patient No      Level of care: Telemetry   Procedures:     08/15/2020 - Cystoscopy with left retrograde pyelogram and left ureteral stent placement  Antimicrobials:  Anti-infectives (From admission, onward)   Start     Dose/Rate Route Frequency Ordered Stop   08/16/20 1400  meropenem (MERREM) 1 g in sodium chloride 0.9 % 100 mL IVPB        1 g 200 mL/hr over 30 Minutes Intravenous Every 8 hours 08/16/20 0834     08/16/20 0600  cefTRIAXone (ROCEPHIN) 1 g in sodium chloride 0.9 % 100 mL IVPB  Status:  Discontinued        1 g 200 mL/hr over 30 Minutes Intravenous Every 24 hours 08/15/20 0758 08/16/20 0523   08/16/20 0600  meropenem (MERREM) 1 g in sodium chloride 0.9 % 100 mL IVPB  Status:  Discontinued         1 g 200 mL/hr over 30 Minutes Intravenous Every 12 hours 08/16/20 0523 08/16/20 0834   08/15/20 0715  cefTRIAXone (ROCEPHIN) 1 g in sodium chloride 0.9 % 100 mL IVPB        1 g 200 mL/hr over 30 Minutes Intravenous  Once 08/15/20 0704 08/15/20 0805       Medication:  . heparin  5,000 Units Subcutaneous Q8H    sodium chloride, acetaminophen **OR** acetaminophen, HYDROcodone-acetaminophen, metoprolol tartrate, morphine injection, ondansetron **OR** ondansetron (ZOFRAN) IV   Objective:   Vitals:   08/17/20 0527 08/17/20 1400 08/17/20 2125 08/18/20 0437  BP: 123/68 125/62 121/68 125/70  Pulse: (!) 53 62 (!) 55 (!) 56  Resp: 18 16 18 14   Temp: 98 F (36.7 C) 98.2 F (36.8 C) 98.6 F (37  C) 97.7 F (36.5 C)  TempSrc: Oral Oral Oral Oral  SpO2: 95% 94% 95% 95%  Weight:      Height:        Intake/Output Summary (Last 24 hours) at 08/18/2020 0917 Last data filed at 08/18/2020 0600 Gross per 24 hour  Intake 420 ml  Output 800 ml  Net -380 ml   Filed Weights   08/15/20 1547  Weight: 117.8 kg     Examination:       Physical Exam:   General:  Alert, oriented, cooperative, no distress;   HEENT:  Normocephalic, PERRL, otherwise with in Normal limits   Neuro:  CNII-XII intact. , normal motor and sensation, reflexes intact   Lungs:   Clear to auscultation BL, Respirations unlabored, no wheezes / crackles  Cardio:    S1/S2, RRR, No murmure, No Rubs or Gallops   Abdomen:   Soft, non-tender, bowel sounds active all four quadrants,  no guarding or peritoneal signs.  Muscular skeletal:  Limited exam - in bed, able to move all 4 extremities, Normal strength,  2+ pulses,  symmetric, No pitting edema  Skin:  Dry, warm to touch, negative for any Rashes,  Wounds: Please see nursing documentation         LABs:  CBC Latest Ref Rng & Units 08/18/2020 08/17/2020 08/16/2020  WBC 4.0 - 10.5 K/uL 14.4(H) 16.9(H) 12.4(H)  Hemoglobin 12.0 - 15.0 g/dL 44.0 10.2 72.5  Hematocrit  36.0 - 46.0 % 45.4 40.8 40.6  Platelets 150 - 400 K/uL 223 194 158   CMP Latest Ref Rng & Units 08/18/2020 08/17/2020 08/16/2020  Glucose 70 - 99 mg/dL 366(Y) 403(K) 742(V)  BUN 6 - 20 mg/dL 95(G) 38(V) 56(E)  Creatinine 0.44 - 1.00 mg/dL 3.32(R) 5.18(A) 4.16(S)  Sodium 135 - 145 mmol/L 142 139 142  Potassium 3.5 - 5.1 mmol/L 4.5 4.6 5.0  Chloride 98 - 111 mmol/L 107 107 108  CO2 22 - 32 mmol/L 29 24 26   Calcium 8.9 - 10.3 mg/dL 10.5(H) 10.3 10.7(H)  Total Protein 6.5 - 8.1 g/dL - - -  Total Bilirubin 0.3 - 1.2 mg/dL - - -  Alkaline Phos 38 - 126 U/L - - -  AST 15 - 41 U/L - - -  ALT 0 - 44 U/L - - -       Micro Results Recent Results (from the past 240 hour(s))  Urine culture     Status: None   Collection Time: 08/12/20  9:23 PM   Specimen: Urine, Clean Catch  Result Value Ref Range Status   Specimen Description   Final    URINE, CLEAN CATCH Performed at [facility], [address] [place], [place]    Special Requests   Final    NONE Performed at [facility], [address] [place], [place]    Culture   Final    NO GROWTH Performed at [facility], [address] [place], [place]    Report Status 08/14/2020 FINAL  Final  Blood culture (routine single)     Status: None (Preliminary result)   Collection Time: 08/15/20  1:35 AM   Specimen: BLOOD LEFT HAND  Result Value Ref Range Status   Specimen Description   Final    BLOOD LEFT HAND Performed at [facility], [address], [place], [place]    Special Requests   Final    BOTTLES DRAWN AEROBIC  AND ANAEROBIC Blood Culture results may not be optimal due to an inadequate volume of blood received in culture bottles Performed at [facility], [address], [place], [place] [address]    Culture   Final    NO GROWTH 2 DAYS Performed at [facility], [address] [place], [place] [address]    Report  Status PENDING  Incomplete  Resp Panel by RT-PCR (Flu A&B, Covid) Nasopharyngeal Swab     Status: None   Collection Time: 08/15/20  1:58 AM   Specimen: Nasopharyngeal Swab; Nasopharyngeal(NP) swabs in vial transport medium  Result Value Ref Range Status   SARS Coronavirus 2 by RT PCR NEGATIVE NEGATIVE Final    Comment: (NOTE) SARS-CoV-2 target nucleic acids are NOT DETECTED.  The SARS-CoV-2 RNA is generally detectable in upper respiratory specimens during the acute phase of infection. The lowest concentration of SARS-CoV-2 viral copies this assay can detect is 138 copies/mL. A negative result does not preclude SARS-Cov-2 infection and should not be used as the sole basis for treatment or other patient management decisions. A negative result may occur with  improper specimen collection/handling, submission of specimen other than nasopharyngeal swab, presence of viral mutation(s) within the areas targeted by this assay, and inadequate number of viral copies(<138 copies/mL). A negative result must be combined with clinical observations, patient history, and epidemiological information. The expected result is Negative.  Fact Sheet for Patients:  BloggerCourse.com  Fact Sheet for Healthcare Providers:  SeriousBroker.it  This test is no t yet approved or cleared by the [place] FDA and  has been authorized for detection and/or diagnosis of SARS-CoV-2 by FDA under an Emergency Use Authorization (EUA). This EUA will remain  in effect (meaning this test can be used) for the duration of the COVID-19 declaration under Section 564(b)(1) of the Act, 21 U.S.C.section 360bbb-3(b)(1), unless the authorization is terminated  or revoked sooner.       Influenza A by PCR NEGATIVE NEGATIVE Final   Influenza B by PCR NEGATIVE NEGATIVE Final    Comment: (NOTE) The Xpert Xpress SARS-CoV-2/FLU/RSV plus assay is intended as an aid in the diagnosis  of influenza from Nasopharyngeal swab specimens and should not be used as a sole basis for treatment. Nasal washings and aspirates are unacceptable for Xpert Xpress SARS-CoV-2/FLU/RSV testing.  Fact Sheet for Patients: BloggerCourse.com  Fact Sheet for Healthcare Providers: SeriousBroker.it  This test is not yet approved or cleared by the [place] FDA and has been authorized for detection and/or diagnosis of SARS-CoV-2 by FDA under an Emergency Use Authorization (EUA). This EUA will remain in effect (meaning this test can be used) for the duration of the COVID-19 declaration under Section 564(b)(1) of the Act, 21 U.S.C. section 360bbb-3(b)(1), unless the authorization is terminated or revoked.  Performed at [facility], [address] [place], [place] [address]   Urine culture     Status: None   Collection Time: 08/15/20  3:20 AM   Specimen: In/Out Cath Urine  Result Value Ref Range Status   Specimen Description   Final    IN/OUT CATH URINE Performed at [facility], [address] [place], [place] [address]    Special Requests   Final    NONE Performed at [facility], [address] [place], [place] [address]    Culture   Final    NO GROWTH Performed at [facility], [address] [place], [place] [address]  Report Status 08/16/2020 FINAL  Final  Culture, blood (x 2)     Status: Abnormal   Collection Time: 08/15/20  8:25 AM   Specimen: BLOOD  Result Value Ref Range Status   Specimen Description   Final    BLOOD LEFT ANTECUBITAL Performed at [facility], [address], [place], [place] [address]    Special Requests   Final    BOTTLES DRAWN AEROBIC AND ANAEROBIC Blood Culture adequate volume Performed at [facility], [address] [place], [place] [address]    Culture  Setup Time   Final    GRAM  NEGATIVE RODS ANAEROBIC BOTTLE ONLY CRITICAL VALUE NOTED.  VALUE IS CONSISTENT WITH PREVIOUSLY REPORTED AND CALLED VALUE.    Culture (A)  Final    ESCHERICHIA COLI SUSCEPTIBILITIES PERFORMED ON PREVIOUS CULTURE WITHIN THE LAST 5 DAYS. Performed at [facility], [address], [place], [place] [address]    Report Status 08/18/2020 FINAL  Final  Culture, blood (x 2)     Status: Abnormal   Collection Time: 08/15/20  8:25 AM   Specimen: BLOOD  Result Value Ref Range Status   Specimen Description   Final    BLOOD RIGHT ANTECUBITAL Performed at [facility], [address], [place], [place] [address]    Special Requests   Final    BOTTLES DRAWN AEROBIC AND ANAEROBIC Blood Culture adequate volume Performed at [facility], [address] [place], [place] [address]    Culture  Setup Time   Final    ANAEROBIC BOTTLE ONLY GRAM NEGATIVE RODS Organism ID to follow CRITICAL RESULT CALLED TO, READ BACK BY AND VERIFIED WITH: Damaris Hippo [facility] 08/16/20 0454 JDW Performed at [facility], [address] [place], [place] [address]    Culture (A)  Final    ESCHERICHIA COLI Confirmed Extended Spectrum Beta-Lactamase Producer (ESBL).  In bloodstream infections from ESBL organisms, carbapenems are preferred over piperacillin/tazobactam. They are shown to have a lower risk of mortality.    Report Status 08/18/2020 FINAL  Final   Organism ID, Bacteria ESCHERICHIA COLI  Final      Susceptibility   Escherichia coli - MIC*    AMPICILLIN >=32 RESISTANT Resistant     CEFAZOLIN >=64 RESISTANT Resistant     CEFEPIME 0.5 SENSITIVE Sensitive     CEFTAZIDIME RESISTANT Resistant     CEFTRIAXONE 32 RESISTANT Resistant     CIPROFLOXACIN >=4 RESISTANT Resistant     GENTAMICIN <=1 SENSITIVE Sensitive     IMIPENEM <=0.25 SENSITIVE Sensitive     TRIMETH/SULFA >=320 RESISTANT Resistant     AMPICILLIN/SULBACTAM >=32 RESISTANT Resistant     PIP/TAZO 8 SENSITIVE  Sensitive     * ESCHERICHIA COLI  Blood Culture ID Panel (Reflexed)     Status: Abnormal   Collection Time: 08/15/20  8:25 AM  Result Value Ref Range Status   Enterococcus faecalis NOT DETECTED NOT DETECTED Final   Enterococcus Faecium NOT DETECTED NOT DETECTED Final   Listeria monocytogenes NOT DETECTED NOT DETECTED Final   Staphylococcus species NOT DETECTED NOT DETECTED Final   Staphylococcus aureus (BCID) NOT DETECTED NOT DETECTED Final   Staphylococcus epidermidis NOT DETECTED NOT DETECTED Final   Staphylococcus lugdunensis NOT DETECTED NOT DETECTED Final   Streptococcus species NOT DETECTED NOT DETECTED Final   Streptococcus agalactiae NOT DETECTED NOT DETECTED Final   Streptococcus pneumoniae NOT DETECTED NOT DETECTED Final   Streptococcus pyogenes NOT DETECTED NOT DETECTED  Final   A.calcoaceticus-baumannii NOT DETECTED NOT DETECTED Final   Bacteroides fragilis NOT DETECTED NOT DETECTED Final   Enterobacterales DETECTED (A) NOT DETECTED Final    Comment: Enterobacterales represent a large order of gram negative bacteria, not a single organism. CRITICAL RESULT CALLED TO, READ BACK BY AND VERIFIED WITH: Damaris Hippo [facility] 08/16/20 0454 JDW    Enterobacter cloacae complex NOT DETECTED NOT DETECTED Final   Escherichia coli DETECTED (A) NOT DETECTED Final    Comment: CRITICAL RESULT CALLED TO, READ BACK BY AND VERIFIED WITH: Damaris Hippo [facility] 08/16/20 0454 JDW    Klebsiella aerogenes NOT DETECTED NOT DETECTED Final   Klebsiella oxytoca NOT DETECTED NOT DETECTED Final   Klebsiella pneumoniae NOT DETECTED NOT DETECTED Final   Proteus species NOT DETECTED NOT DETECTED Final   Salmonella species NOT DETECTED NOT DETECTED Final   Serratia marcescens NOT DETECTED NOT DETECTED Final   Haemophilus influenzae NOT DETECTED NOT DETECTED Final   Neisseria meningitidis NOT DETECTED NOT DETECTED Final   Pseudomonas aeruginosa NOT DETECTED NOT DETECTED Final   Stenotrophomonas maltophilia NOT  DETECTED NOT DETECTED Final   Candida albicans NOT DETECTED NOT DETECTED Final   Candida auris NOT DETECTED NOT DETECTED Final   Candida glabrata NOT DETECTED NOT DETECTED Final   Candida krusei NOT DETECTED NOT DETECTED Final   Candida parapsilosis NOT DETECTED NOT DETECTED Final   Candida tropicalis NOT DETECTED NOT DETECTED Final   Cryptococcus neoformans/gattii NOT DETECTED NOT DETECTED Final   CTX-M ESBL DETECTED (A) NOT DETECTED Final    Comment: CRITICAL RESULT CALLED TO, READ BACK BY AND VERIFIED WITH: Damaris Hippo [facility] 08/16/20 0454 JDW (NOTE) Extended spectrum beta-lactamase detected. Recommend a carbapenem as initial therapy.      Carbapenem resistance IMP NOT DETECTED NOT DETECTED Final   Carbapenem resistance KPC NOT DETECTED NOT DETECTED Final   Carbapenem resistance NDM NOT DETECTED NOT DETECTED Final   Carbapenem resist OXA 48 LIKE NOT DETECTED NOT DETECTED Final   Carbapenem resistance VIM NOT DETECTED NOT DETECTED Final    Comment: Performed at [facility], [address], [place], [place] [address]  Urine Culture     Status: Abnormal   Collection Time: 08/15/20  1:29 PM   Specimen: PATH Cytology Urine  Result Value Ref Range Status   Specimen Description   Final    URINE, RANDOM Performed at [facility], [address], [place], [place] [address]    Special Requests   Final    NONE Performed at [facility], [address], [place], [place] [address]    Culture (A)  Final    >=100,000 COLONIES/mL ESCHERICHIA COLI Confirmed Extended Spectrum Beta-Lactamase Producer (ESBL).  In bloodstream infections from ESBL organisms, carbapenems are preferred over piperacillin/tazobactam. They are shown to have a lower risk of mortality.    Report Status 08/17/2020 FINAL  Final   Organism ID, Bacteria ESCHERICHIA COLI (A)  Final      Susceptibility   Escherichia coli - MIC*    AMPICILLIN >=32 RESISTANT Resistant      CEFAZOLIN >=64 RESISTANT Resistant     CEFEPIME 1 SENSITIVE Sensitive     CEFTRIAXONE 32 RESISTANT Resistant     CIPROFLOXACIN >=4 RESISTANT Resistant     GENTAMICIN <=1 SENSITIVE Sensitive     IMIPENEM <=0.25 SENSITIVE Sensitive     NITROFURANTOIN 128 RESISTANT Resistant     TRIMETH/SULFA >=320 RESISTANT Resistant     AMPICILLIN/SULBACTAM >=32 RESISTANT Resistant  PIP/TAZO 8 SENSITIVE Sensitive     * >=100,000 COLONIES/mL ESCHERICHIA COLI  Culture, blood (routine x 2)     Status: None (Preliminary result)   Collection Time: 08/16/20  6:35 PM   Specimen: BLOOD  Result Value Ref Range Status   Specimen Description   Final    BLOOD LEFT ANTECUBITAL Performed at [facility], [address], [place], [place] [address]    Special Requests   Final    BOTTLES DRAWN AEROBIC AND ANAEROBIC Blood Culture adequate volume Performed at [facility], [address] [place], [place] [address]    Culture   Final    NO GROWTH < 12 HOURS Performed at [facility], [address] [place], [place] [address]    Report Status PENDING  Incomplete  Culture, blood (routine x 2)     Status: None (Preliminary result)   Collection Time: 08/16/20  6:35 PM   Specimen: BLOOD  Result Value Ref Range Status   Specimen Description   Final    BLOOD RIGHT ANTECUBITAL Performed at [facility], [address] [place], [place] [address]    Special Requests   Final    BOTTLES DRAWN AEROBIC AND ANAEROBIC Blood Culture adequate volume Performed at [facility], [address], [place], [place] [address]    Culture   Final    NO GROWTH < 12 HOURS Performed at [facility], [address] [place], [place] [address]    Report Status PENDING  Incomplete    Radiology Reports CT Angio Chest PE W and/or Wo Contrast  Result Date: 08/15/2020 CLINICAL DATA:  Left flank pain, shortness of breath, white blood cell 10,  negative COVID. Pulmonary embolus suspected. Recent diagnosis of renal stone on 08/12/2020. EXAM: CT ANGIOGRAPHY CHEST WITH CONTRAST TECHNIQUE: Multidetector CT imaging of the chest was performed using the standard protocol during bolus administration of intravenous contrast. Multiplanar CT image reconstructions and MIPs were obtained to evaluate the vascular anatomy. CONTRAST:  80mL OMNIPAQUE IOHEXOL 350 MG/ML SOLN COMPARISON:  CT renal 08/12/2020, chest x-ray 08/15/2020 FINDINGS: Cardiovascular: Satisfactory opacification of the pulmonary arteries to the segmental level. No evidence of pulmonary embolism. The main pulmonary artery is enlarged in caliber. Normal heart size. No significant pericardial effusion. The thoracic aorta is normal in caliber. No atherosclerotic plaque of the thoracic aorta. No coronary artery calcifications. Mediastinum/Nodes: Enlarged right paratracheal lymph node measuring up to 1.6 cm. Borderline enlarged prevascular lymph node measuring 0.9 cm. Prominent but nonenlarged hilar lymph nodes. No axillary lymph nodes. Thyroid gland, trachea, and esophagus demonstrate no significant findings. Lungs/Pleura: Low lung volumes. Bilateral lower lobe subsegmental atelectasis. No focal consolidation. No pulmonary nodule. No pulmonary mass. No pleural effusion. No pneumothorax. Upper Abdomen: No acute abnormality. Musculoskeletal: No chest wall abnormality No suspicious lytic or blastic osseous lesions. No acute displaced fracture. Multilevel mild degenerative changes of the spine. Review of the MIP images confirms the above findings. IMPRESSION: 1. No central or segmental pulmonary embolus. Limited evaluation more distally due to artifact. 2. Low lung volumes with no acute intrathoracic abnormality. Electronically Signed   By: Tish Frederickson M.D.   On: 08/15/2020 05:21   DG Chest Port 1 View  Result Date: 08/15/2020 CLINICAL DATA:  Shortness of breath. EXAM: PORTABLE CHEST 1 VIEW COMPARISON:   Lung bases from abdominal CT 3 days ago 08/12/2020 FINDINGS: Lung volumes are low. Mild elevation of right hemidiaphragm. Mild bibasilar atelectasis. No confluent consolidation.  Normal heart size. No pleural fluid or pneumothorax. No acute osseous abnormalities are seen. IMPRESSION: Low lung volumes with mild bibasilar atelectasis. Electronically Signed   By: Narda RutherfordMelanie  Sanford M.D.   On: 08/15/2020 02:05   DG C-Arm 1-60 Min-No Report  Result Date: 08/15/2020 Fluoroscopy was utilized by the requesting physician.  No radiographic interpretation.   CT Renal Stone Study  Result Date: 08/12/2020 CLINICAL DATA:  History of kidney stones with left-sided flank pain. EXAM: CT ABDOMEN AND PELVIS WITHOUT CONTRAST TECHNIQUE: Multidetector CT imaging of the abdomen and pelvis was performed following the standard protocol without IV contrast. COMPARISON:  CT dated October 05, 2019 FINDINGS: Lower chest: There is atelectasis at the right lung base.The heart size is normal. Hepatobiliary: There is a pattern megaly with hepatic steatosis. The hepatic dome is not entirely visualized on this study. There are 2 small lesions in the liver that are stable from prior study and likely represent cysts. The gallbladder is unremarkable. Pancreas: Normal contours without ductal dilatation. No peripancreatic fluid collection. Spleen: Unremarkable. Adrenals/Urinary Tract: --Adrenal glands: Unremarkable. --Right kidney/ureter: No hydronephrosis or radiopaque kidney stones. --Left kidney/ureter: There is moderate left-sided hydronephrosis secondary to a stone measuring approximately 9 mm at the left UPJ. There is an additional nonobstructing stone in the lower pole measuring approximately 4 mm. --Urinary bladder: Unremarkable. Stomach/Bowel: --Stomach/Duodenum: No hiatal hernia or other gastric abnormality. Normal duodenal course and caliber. --Small bowel: Unremarkable. --Colon: Unremarkable. --Appendix: Normal. Vascular/Lymphatic: Normal  course and caliber of the major abdominal vessels. --No retroperitoneal lymphadenopathy. --No mesenteric lymphadenopathy. --No pelvic or inguinal lymphadenopathy. Reproductive: Unremarkable Other: No ascites or free air. The abdominal wall is normal. Musculoskeletal. No acute displaced fractures. IMPRESSION: 1. Moderate left-sided hydronephrosis secondary to a 9 mm stone at the left UPJ. 2. Additional nonobstructing stone in the lower pole of the left kidney measuring approximately 4 mm. 3. Hepatomegaly with hepatic steatosis. Electronically Signed   By: Katherine Mantlehristopher  Green M.D.   On: 08/12/2020 23:38   US EKG SITE RITE  Result Date: 08/17/2020 If Site Rite image not attached, placement could not be confirmed due to current cardiac rhythm.   SIGNED: Kendell BaneSeyed A Shahmehdi, MD, FHM. [facility],  Pager (please use [email or web address] to page/text) Please use Epic Secure Chat for non-urgent communication (7AM-7PM)  If 7PM-7AM, please contact night-coverage [email or web address], 08/18/2020, 9:17 AM

## 2020-08-18 NOTE — Progress Notes (Signed)
PHARMACY CONSULT NOTE FOR:  OUTPATIENT  PARENTERAL ANTIBIOTIC THERAPY (OPAT)  Indication: ESBL Bacteremia  Regimen: Ertapenem 1g IV q24h End date: 09/01/2020  IV antibiotic discharge orders are pended. To discharging provider:  please sign these orders via discharge navigator,  Select New Orders & click on the button choice - Manage This Unsigned Work.     Thank you for allowing pharmacy to be a part of this patient's care.  Jeannetta Nap 08/18/2020, 1:48 PM

## 2020-08-18 NOTE — Progress Notes (Signed)
[facility]  Date of Admission:  08/15/2020           Reason for visit: Follow up on ESBL bacteremia  Current antibiotics: Day 3 meropenem (2/16--present)  Previous antibiotics: Ceftriaxone x 1 dose (2/15)    ASSESSMENT:    ESBL E. coli bacteremia and sepsis: Secondary to urinary source with obstructing left UPJ stone with moderate hydronephrosis status post stenting 08/15/2020.   Leukocytosis: Secondary to above and improving Acute kidney injury: Improving History of nephrolithiasis  PLAN:    Transition to ertapenem D/w urology and plan for stone management in the next 7-10 days.  Will continue IV abx until through that time See OPAT below  Diagnosis: ESBL Bacteremia  Culture Result: ESBL  Allergies  Allergen Reactions  . Ibuprofen Other (See Comments)    "Funny feeling in my head"    OPAT Orders Discharge antibiotics to be given via PICC line Discharge antibiotics: Per pharmacy protocol ertapenem 1gm q24h  Duration: 2 weeks End Date: 09/01/20  [facility] Care Per Protocol:  Home health RN for IV administration and teaching; PICC line care and labs.    Labs weekly while on IV antibiotics: _x_ CBC with differential _x_ BMP __ CMP __ CRP __ ESR __ Vancomycin trough __ CK  _x_ Please pull PIC at completion of IV antibiotics __ Please leave PIC in place until doctor has seen patient or been notified  Fax weekly labs to [phone]  Clinic Follow Up Appt: 08/29/20 @ 245pm with Dr Juleen China   SUBJECTIVE:   24 hour events:  No acute events picc placed Blood cx negative afebrile  No new complaints.  No fevers or chills.  Got PICC placed   OBJECTIVE:   Blood pressure 125/70, pulse (!) 56, temperature 97.7 F (36.5 C), temperature source Oral, resp. rate 14, height _0  (1.575 m), weight 117.8 kg, SpO2 95 %. Body mass index is 47.5 kg/m.  Physical Exam Constitutional:      General: She is not in acute distress.     Appearance: Normal appearance.  HENT:     Head: Normocephalic and atraumatic.  Musculoskeletal:     Comments: + UE picc line  Neurological:     General: No focal deficit present.     Mental Status: She is alert and oriented to person, place, and time.  Psychiatric:        Mood and Affect: Mood normal.        Behavior: Behavior normal.      Lab Results: Lab Results  Component Value Date   WBC 14.4 (H) 08/18/2020   HGB 14.1 08/18/2020   HCT 45.4 08/18/2020   MCV 90.6 08/18/2020   PLT 223 08/18/2020    Lab Results  Component Value Date   NA 142 08/18/2020   K 4.5 08/18/2020   CO2 29 08/18/2020   GLUCOSE 109 (H) 08/18/2020   BUN 26 (H) 08/18/2020   CREATININE 1.11 (H) 08/18/2020   CALCIUM 10.5 (H) 08/18/2020   GFRNONAA 58 (L) 08/18/2020   GFRAA >60 12/28/2019    Lab Results  Component Value Date   ALT 25 08/15/2020   AST 26 08/15/2020   ALKPHOS 108 08/15/2020   BILITOT 2.8 (H) 08/15/2020   BILITOT 2.5 (H) 08/15/2020    No results found for: CRP  No results found for: ESRSEDRATE   I have reviewed the micro and lab results in Epic.  Imaging: [place] EKG SITE RITE  Result Date:  08/17/2020 If Site Rite image not attached, placement could not be confirmed due to current cardiac rhythm.    Imaging independently reviewed in Epic.    Raynelle High[facility] for [facility] [phone] pager 08/18/2020, 1:49 PM

## 2020-08-18 NOTE — Discharge Summary (Signed)
Physician Discharge Summary [facility]    Patient: Candace Wright                   Admit date: 08/15/2020   DOB: Jan 25, 1961             Discharge date:08/18/2020/4:13 PM DHR:[id number]                          PCP: Candace Pillar, MD  Disposition:  HOME with Home Health   Recommendations for Outpatient Follow-up:   Follow up: With your urologist and infectious disease team  Discharge Condition: Stable   Code Status:   Code Status: Full Code  Diet recommendation: Regular healthy diet   Discharge Diagnoses:    Principal Problem:   ESBL (extended spectrum beta-lactamase) producing bacteria infection Active Problems:   Shortness of breath   Acute hypoxemic respiratory failure (HCC)   Sepsis ([place])   Obstruction of left ureteropelvic junction (UPJ) due to stone   AKI (acute kidney injury) ([place])   E coli bacteremia   History of Present Illness/ Hospital Course Candace Wright Summary:    60 year old female with a history of nephrolithiasis and recently diagnosed 9 mm left UPJ stone with moderate left sided hydronephrosis on 2/12/2022who presented to the [facility] acute onset chills and tachypnea/shortness of breath .  Patient states that she has been dealing with UTIs for the past year and has previously been diagnosed with nephrolithiasis but has not had any interventions as these were small stones. States that Friday night she had left sided pain which prompted her to go to the ED on Saturday and was diagnosed with a 9 mm left UPJ stone with moderate left-sided hydro.  [facility] (Monday)and plansto schedule a lithotripsy, however this was not scheduled yet. Last night she had acute onset symptoms as above prompting her to come to the ED.  Has been sitting around most of the day for the past several days. Shortness of breath improved with O2 by EMS. No history of tobacco use, asthma or COPD or DVT/PE.   ED Course: Candace Wright was  Febrile, tachycardic, tachypneic, hemodynamically stable, SpO2dropped to 87% with ambulation on room air andwas placed on 2 to 3 L/min O2.  Labs:Sodium 137, K4.0, BUN 19, creatinine 1.6, lactic acid 1.8, WBC 10.0, Hb 14.3, platelets 204, UA unremarkable. COVID-19 and flu negative. Imaging:CXR-low lung volumes with mild bibasilar atelectasis. CTA chest-low lung volumes with no acute intrathoracic abnormality.  Patient receivedceftriaxone, 1 L NS bolus, albuterol and Tylenol.  1. Sepsis without septic shock suspect secondary to obstructing left UPJ stone with hydronephrosis - ESBL bacteremia Enterobacter/E. Coli (blood cx fron 2/15) a. Sepsis criteria: Fever, tachycardia, tachypnea -resolved b. s/p IV fluid boluses per sepsis protocol -BP stabilized c. Blood cultures -- Enterobacter/E. Coli (blood cx fron 2/15) d. Urine culture - >100 K colonies of E. coli e. IV ceftriaxone --- changed to IV  Meropenem >> to be continued at home total 09/01/2020 per ID recommendations f. Urology consulted, with Dr. Gloriann Wright. g. POD #3 s/p Cystoscopy with leftretrograde pyelogram and leftureteral stent placement h. Continue current  Abx,  IV Meropenem till culture sensitivity finalized i. ID consulted recommended to continue meropenem 1 g every 8 hours--till 09/01/2020 j. PICC line has been placed, antibiotics to be continued with home infusion Home health has been arranged k. Repeating blood cultures 08/17/2020 l. Follow-up with urologist  contact precautions  2. Acute hypoxic respiratory failure, suspect secondary to atelectasis a. SpO2 intermittently drops to high 80s low 90s on room air -resolved b. Atelectasis on CXR and rales on exam c. Incentive spirometry  d. Satting 95% on room air  3. Elevated creatinine a. Creatinine 1.6 >> 1.82 >>1.42  >> 1.06 >> 1.11 today  b.  baseline 0.8 on June 2021 c. Monitoring d. Avoiding nephrotoxins  4. Elevated T billi a. AST/ALT unremarkable b. Direct  bilirubin 0.8, indirect bili Candace Wright 1.7, total bilirubin 2.8  5. Abnormal CT chest a. Incidentalenlarged right paratracheal lymph node measuring 1.6 cmnoted - Discussed with Dr. 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   ----------------------------------------------------------------------------------------------------------------------------------- Cultures; Blood Cultures 1/2   >> on 08/15/2020  Enterobacter and E. Coli Repeat blood cultures 08/17/2020 Urine Culture  >>> E. Coli > 100 k Colonies    Antimicrobials: IV Rocephin  >> IV meropenem--to be continued at home per ID recommendation 09/01/2020   Consultants: Urologist / ID    ----------------------------------------------------------------------------------------------------------------------------------- Code Status:   Code Status: Full Code  Family Communication: Discussed with patient and her husband at bedside    Dispo: The patient is from: Home  Anticipated d/c is to: Home  PICC line has been placed 08/18/2020, home health RN has been arranged.  ID has arranged for outpatient IV antibiotics ertapenem till 09/01/2020       Discharge Instructions:   Discharge Instructions    Activity as tolerated - No restrictions   Complete by: As directed    [facility] pharmacist to adjust dose for Vancomycin, Aminoglycosides and other anti-infective therapies as requested by physician.   Complete by: As directed    [facility] to provide Cath Flo 34m   Complete by: As directed    Administer for PICC line occlusion and as ordered by physician for other access device issues.   Anaphylaxis Kit: Provided to treat any anaphylactic reaction to the medication being provided to the patient if First Dose or when requested by physician   Complete by: As directed    Epinephrine 194mml vial / amp: Administer 0.23m57m0.23ml82mubcutaneously once for  moderate to severe anaphylaxis, nurse to call physician and pharmacy when reaction occurs and call 911 if needed for immediate care   Diphenhydramine 50mg61mIV vial: Administer 25-50mg 82mM PRN for first dose reaction, rash, itching, mild reaction, nurse to call physician and pharmacy when reaction occurs   Sodium Chloride 0.9% NS 500ml I70mdminister if needed for hypovolemic blood pressure drop or as ordered by physician after call to physician with anaphylactic reaction   Call MD for:  difficulty breathing, headache or visual disturbances   Complete by: As directed    Call MD for:  persistant dizziness or light-headedness   Complete by: As directed    Call MD for:  temperature >100.4   Complete by: As directed    Change dressing on IV access line weekly and PRN   Complete by: As directed    Diet - low sodium heart healthy   Complete by: As directed    Discharge instructions   Complete by: As directed    Home health has been arranged, PICC line is placed, to continue IV antibiotics per ID recommendation.. So far instruction from ID/urologist is to continue antibiotics till seen by urologist for stone manipulation and stent removal. Please follow-up with the urology of the specific instructions Follow-up with infectious disease team   Flush IV  access with Sodium Chloride 0.9% and Heparin 10 units/ml or 100 units/ml   Complete by: As directed    Home infusion instructions - [facility]   Complete by: As directed    Instructions: Flush IV access with Sodium Chloride 0.9% and Heparin 10units/ml or 100units/ml   Change dressing on IV access line: Weekly and PRN   Instructions Cath Flo 41m: Administer for PICC Line occlusion and as ordered by physician for other access device   [facility] pharmacist to adjust dose for: Vancomycin, Aminoglycosides and other anti-infective therapies as requested by physician   Increase activity slowly   Complete by: As directed     Method of administration may be changed at the discretion of home infusion pharmacist based upon assessment of the patient and/or caregiver's ability to self-administer the medication ordered   Complete by: As directed    No wound care   Complete by: As directed    Outpatient Parenteral Antibiotic Therapy Information Antibiotic: Ertapenem (Invanz) IVPB; Indications for use: Bacteremia; End Date: 08/28/2020   Complete by: As directed    Antibiotic: Ertapenem ([facility]bert Ewing IVPB   Indications for use: Bacteremia   End Date: 08/28/2020       Medication List    STOP taking these medications   cephALEXin 500 MG capsule Commonly known as: KEFLEX     TAKE these medications   acetaminophen 325 MG tablet Commonly known as: TYLENOL Take 2 tablets (650 mg total) by mouth every 6 (six) hours as needed for mild pain (or Fever >/= 101).   ertapenem  IVPB Commonly known as: INVANZ Inject 1 g into the vein daily for 14 days. Indication:  ESBL Bacteremia First Dose: No Last Day of Therapy:  09/01/2020 Labs - Once weekly:  CBC/D and BMP, Labs - Every other week:  ESR and CRP Method of administration: Mini-Bag Plus / Gravity Method of administration may be changed at the discretion of home infusion pharmacist based upon assessment of the patient and/or caregiver's ability to self-administer the medication ordered.   HYDROcodone-acetaminophen 5-325 MG tablet Commonly known as: Norco Take 1 tablet by mouth every 6 (six) hours as needed for moderate pain.   lactobacillus acidophilus Tabs tablet Take 2 tablets by mouth 3 (three) times daily for 10 days.   ondansetron 4 MG tablet Commonly known as: ZOFRAN Take 4 mg by mouth every 8 (eight) hours as needed for nausea or vomiting. What changed: Another medication with the same name was added. Make sure you understand how and when to take each.   ondansetron 4 MG tablet Commonly known as: ZOFRAN Take 1 tablet (4 mg total) by mouth every 6 (six) hours as  needed for nausea. What changed: You were already taking a medication with the same name, and this prescription was added. Make sure you understand how and when to take each.   tamsulosin 0.4 MG Caps capsule Commonly known as: Flomax Take 1 capsule (0.4 mg total) by mouth daily. What changed: when to take this       Allergies  Allergen Reactions  . Ibuprofen Other (See Comments)    "Funny feeling in my head"     Procedures /Studies:   CT Angio Chest PE W and/or Wo Contrast  Result Date: 08/15/2020 CLINICAL DATA:  Left flank pain, shortness of breath, white blood cell 10, negative COVID. Pulmonary embolus suspected. Recent diagnosis of renal stone on 08/12/2020. EXAM: CT ANGIOGRAPHY CHEST WITH CONTRAST TECHNIQUE: Multidetector CT imaging of the chest was  performed using the standard protocol during bolus administration of intravenous contrast. Multiplanar CT image reconstructions and MIPs were obtained to evaluate the vascular anatomy. CONTRAST:  71m OMNIPAQUE IOHEXOL 350 MG/ML SOLN COMPARISON:  CT renal 08/12/2020, chest x-ray 08/15/2020 FINDINGS: Cardiovascular: Satisfactory opacification of the pulmonary arteries to the segmental level. No evidence of pulmonary embolism. The main pulmonary artery is enlarged in caliber. Normal heart size. No significant pericardial effusion. The thoracic aorta is normal in caliber. No atherosclerotic plaque of the thoracic aorta. No coronary artery calcifications. Mediastinum/Nodes: Enlarged right paratracheal lymph node measuring up to 1.6 cm. Borderline enlarged prevascular lymph node measuring 0.9 cm. Prominent but nonenlarged hilar lymph nodes. No axillary lymph nodes. Thyroid gland, trachea, and esophagus demonstrate no significant findings. Lungs/Pleura: Low lung volumes. Bilateral lower lobe subsegmental atelectasis. No focal consolidation. No pulmonary nodule. No pulmonary mass. No pleural effusion. No pneumothorax. Upper Abdomen: No acute  abnormality. Musculoskeletal: No chest wall abnormality No suspicious lytic or blastic osseous lesions. No acute displaced fracture. Multilevel mild degenerative changes of the spine. Review of the MIP images confirms the above findings. IMPRESSION: 1. No central or segmental pulmonary embolus. Limited evaluation more distally due to artifact. 2. Low lung volumes with no acute intrathoracic abnormality. Electronically Signed   By: MIven FinnM.D.   On: 08/15/2020 05:21   DG Chest Port 1 View  Result Date: 08/15/2020 CLINICAL DATA:  Shortness of breath. EXAM: PORTABLE CHEST 1 VIEW COMPARISON:  Lung bases from abdominal CT 3 days ago 08/12/2020 FINDINGS: Lung volumes are low. Mild elevation of right hemidiaphragm. Mild bibasilar atelectasis. No confluent consolidation. Normal heart size. No pleural fluid or pneumothorax. No acute osseous abnormalities are seen. IMPRESSION: Low lung volumes with mild bibasilar atelectasis. Electronically Signed   By: MKeith RakeM.D.   On: 08/15/2020 02:05   DG C-Arm 1-60 Min-No Report  Result Date: 08/15/2020 Fluoroscopy was utilized by the requesting physician.  No radiographic interpretation.   CT Renal Stone Study  Result Date: 08/12/2020 CLINICAL DATA:  History of kidney stones with left-sided flank pain. EXAM: CT ABDOMEN AND PELVIS WITHOUT CONTRAST TECHNIQUE: Multidetector CT imaging of the abdomen and pelvis was performed following the standard protocol without IV contrast. COMPARISON:  CT dated October 05, 2019 FINDINGS: Lower chest: There is atelectasis at the right lung base.The heart size is normal. Hepatobiliary: There is a pattern megaly with hepatic steatosis. The hepatic dome is not entirely visualized on this study. There are 2 small lesions in the liver that are stable from prior study and likely represent cysts. The gallbladder is unremarkable. Pancreas: Normal contours without ductal dilatation. No peripancreatic fluid collection. Spleen:  Unremarkable. Adrenals/Urinary Tract: --Adrenal glands: Unremarkable. --Right kidney/ureter: No hydronephrosis or radiopaque kidney stones. --Left kidney/ureter: There is moderate left-sided hydronephrosis secondary to a stone measuring approximately 9 mm at the left UPJ. There is an additional nonobstructing stone in the lower pole measuring approximately 4 mm. --Urinary bladder: Unremarkable. Stomach/Bowel: --Stomach/Duodenum: No hiatal hernia or other gastric abnormality. Normal duodenal course and caliber. --Small bowel: Unremarkable. --Colon: Unremarkable. --Appendix: Normal. Vascular/Lymphatic: Normal course and caliber of the major abdominal vessels. --No retroperitoneal lymphadenopathy. --No mesenteric lymphadenopathy. --No pelvic or inguinal lymphadenopathy. Reproductive: Unremarkable Other: No ascites or free air. The abdominal wall is normal. Musculoskeletal. No acute displaced fractures. IMPRESSION: 1. Moderate left-sided hydronephrosis secondary to a 9 mm stone at the left UPJ. 2. Additional nonobstructing stone in the lower pole of the left kidney measuring approximately 4 mm. 3. Hepatomegaly with hepatic  steatosis. Electronically Signed   By: Constance Holster M.D.   On: 08/12/2020 23:38   [place] EKG SITE RITE  Result Date: 08/17/2020 If Site Rite image not attached, placement could not be confirmed due to current cardiac rhythm.    Subjective:   Patient was seen and examined 08/18/2020, 4:13 PM Patient stable today. No acute distress.  No issues overnight Stable for discharge.  Discharge Exam:    Vitals:   08/17/20 1400 08/17/20 2125 08/18/20 0437 08/18/20 1355  BP: 125/62 121/68 125/70 (!) 144/81  Pulse: 62 (!) 55 (!) 56 63  Resp: '16 18 14 20  ' Temp: 98.2 F (36.8 C) 98.6 F (37 C) 97.7 F (36.5 C) 98.1 F (36.7 C)  TempSrc: Oral Oral Oral Oral  SpO2: 94% 95% 95% 96%  Weight:      Height:        General: Pt lying comfortably in bed & appears in no obvious  distress. Cardiovascular: S1 & S2 heard, RRR, S1/S2 +. No murmurs, rubs, gallops or clicks. No JVD or pedal edema. Respiratory: Clear to auscultation without wheezing, rhonchi or crackles. No increased work of breathing. Abdominal:  Non-distended, non-tender & soft. No organomegaly or masses appreciated. Normal bowel sounds heard. CNS: Alert and oriented. No focal deficits. Extremities: no edema, no cyanosis      The results of significant diagnostics from this hospitalization (including imaging, microbiology, ancillary and laboratory) are listed below for reference.      Microbiology:   Recent Results (from the past 240 hour(s))  Urine culture     Status: None   Collection Time: 08/12/20  9:23 PM   Specimen: Urine, Clean Catch  Result Value Ref Range Status   Specimen Description   Final    URINE, CLEAN CATCH Performed at [facility], [address] [place], [place] [address]    Special Requests   Final    NONE Performed at [facility], [address] [place], [place] [address]    Culture   Final    NO GROWTH Performed at [facility], [place] [address] [place], [place] [address]    Report Status 08/14/2020 FINAL  Final  Blood culture (routine single)     Status: None (Preliminary result)   Collection Time: 08/15/20  1:35 AM   Specimen: BLOOD LEFT HAND  Result Value Ref Range Status   Specimen Description   Final    BLOOD LEFT HAND Performed at [facility] [address] [place], [place] [address]    Special Requests   Final    BOTTLES DRAWN AEROBIC AND ANAEROBIC Blood Culture results may not be optimal due to an inadequate volume of blood received in culture bottles Performed at [facility] [address], [place], [place] [address]    Culture   Final    NO GROWTH 3 DAYS Performed at [facility], [place] [address], [place], [place] [address]    Report Status PENDING  Incomplete  Resp  Panel by RT-PCR (Flu A&B, Covid) Nasopharyngeal Swab     Status: None   Collection Time: 08/15/20  1:58 AM   Specimen: Nasopharyngeal Swab; Nasopharyngeal(NP) swabs in vial transport medium  Result Value Ref Range Status   SARS Coronavirus 2 by RT PCR NEGATIVE NEGATIVE Final    Comment: (NOTE) SARS-CoV-2 target nucleic acids are NOT DETECTED.  The SARS-CoV-2 RNA is generally detectable in upper respiratory specimens during the acute phase  of infection. The lowest concentration of SARS-CoV-2 viral copies this assay can detect is 138 copies/mL. A negative result does not preclude SARS-Cov-2 infection and should not be used as the sole basis for treatment or other patient management decisions. A negative result may occur with  improper specimen collection/handling, submission of specimen other than nasopharyngeal swab, presence of viral mutation(s) within the areas targeted by this assay, and inadequate number of viral copies(<138 copies/mL). A negative result must be combined with clinical observations, patient history, and epidemiological information. The expected result is Negative.  Fact Sheet for Patients:  EntrepreneurPulse.com.au  Fact Sheet for Healthcare Providers:  IncredibleEmployment.be  This test is no t yet approved or cleared by the [place] FDA and  has been authorized for detection and/or diagnosis of SARS-CoV-2 by FDA under an Emergency Use Authorization (EUA). This EUA will remain  in effect (meaning this test can be used) for the duration of the COVID-19 declaration under Section 564(b)(1) of the Act, 21 U.S.C.section 360bbb-3(b)(1), unless the authorization is terminated  or revoked sooner.       Influenza A by PCR NEGATIVE NEGATIVE Final   Influenza B by PCR NEGATIVE NEGATIVE Final    Comment: (NOTE) The Xpert Xpress SARS-CoV-2/FLU/RSV plus assay is intended as an aid in the diagnosis of influenza from Nasopharyngeal  swab specimens and should not be used as a sole basis for treatment. Nasal washings and aspirates are unacceptable for Xpert Xpress SARS-CoV-2/FLU/RSV testing.  Fact Sheet for Patients: EntrepreneurPulse.com.au  Fact Sheet for Healthcare Providers: IncredibleEmployment.be  This test is not yet approved or cleared by the [place] FDA and has been authorized for detection and/or diagnosis of SARS-CoV-2 by FDA under an Emergency Use Authorization (EUA). This EUA will remain in effect (meaning this test can be used) for the duration of the COVID-19 declaration under Section 564(b)(1) of the Act, 21 U.S.C. section 360bbb-3(b)(1), unless the authorization is terminated or revoked.  Performed at [facility], [place] [address] [place], [place] [address]   Urine culture     Status: None   Collection Time: 08/15/20  3:20 AM   Specimen: In/Out Cath Urine  Result Value Ref Range Status   Specimen Description   Final    IN/OUT CATH URINE Performed at [place] [address], [place], [place] [address]    Special Requests   Final    NONE Performed at [facility], [place] [address], [place], [place] [address]    Culture   Final    NO GROWTH Performed at [facility], [place] [address] [place], [place] [address]    Report Status 08/16/2020 FINAL  Final  Culture, blood (x 2)     Status: Abnormal   Collection Time: 08/15/20  8:25 AM   Specimen: BLOOD  Result Value Ref Range Status   Specimen Description   Final    BLOOD LEFT ANTECUBITAL Performed at [place] [address], [place], [place] [address]    Special Requests   Final    BOTTLES DRAWN AEROBIC AND ANAEROBIC Blood Culture adequate volume Performed at [facility] [address], [place], [place] [address]    Culture  Setup Time   Final    GRAM NEGATIVE RODS ANAEROBIC BOTTLE  ONLY CRITICAL VALUE NOTED.  VALUE IS CONSISTENT WITH PREVIOUSLY REPORTED AND CALLED VALUE.    Culture (A)  Final    ESCHERICHIA COLI SUSCEPTIBILITIES PERFORMED ON PREVIOUS CULTURE WITHIN  THE LAST 5 DAYS. Performed at [facility], [place] [address], [place], [place] [address]    Report Status 08/18/2020 FINAL  Final  Culture, blood (x 2)     Status: Abnormal   Collection Time: 08/15/20  8:25 AM   Specimen: BLOOD  Result Value Ref Range Status   Specimen Description   Final    BLOOD RIGHT ANTECUBITAL Performed at [place] [address] [place], [place] [address]    Special Requests   Final    BOTTLES DRAWN AEROBIC AND ANAEROBIC Blood Culture adequate volume Performed at [place] [address] [place], [place] [address]    Culture  Setup Time   Final    ANAEROBIC BOTTLE ONLY GRAM NEGATIVE RODS Organism ID to follow CRITICAL RESULT CALLED TO, READ BACK BY AND VERIFIED WITH: [place] Slider [facility] 08/16/20 0454 JDW Performed at [facility], [address] [place], [place] [address]    Culture (A)  Final    ESCHERICHIA COLI Confirmed Extended Spectrum Beta-Lactamase Producer (ESBL).  In bloodstream infections from ESBL organisms, carbapenems are preferred over piperacillin/tazobactam. They are shown to have a lower risk of mortality.    Report Status 08/18/2020 FINAL  Final   Organism ID, Bacteria ESCHERICHIA COLI  Final      Susceptibility   Escherichia coli - MIC*    AMPICILLIN >=32 RESISTANT Resistant     CEFAZOLIN >=64 RESISTANT Resistant     CEFEPIME 0.5 SENSITIVE Sensitive     CEFTAZIDIME RESISTANT Resistant     CEFTRIAXONE 32 RESISTANT Resistant     CIPROFLOXACIN >=4 RESISTANT Resistant     GENTAMICIN <=1 SENSITIVE Sensitive     IMIPENEM <=0.25 SENSITIVE Sensitive     TRIMETH/SULFA >=320 RESISTANT Resistant     AMPICILLIN/SULBACTAM >=32 RESISTANT Resistant     PIP/TAZO 8 SENSITIVE Sensitive     * ESCHERICHIA  COLI  Blood Culture ID Panel (Reflexed)     Status: Abnormal   Collection Time: 08/15/20  8:25 AM  Result Value Ref Range Status   Enterococcus faecalis NOT DETECTED NOT DETECTED Final   Enterococcus Faecium NOT DETECTED NOT DETECTED Final   Listeria monocytogenes NOT DETECTED NOT DETECTED Final   Staphylococcus species NOT DETECTED NOT DETECTED Final   Staphylococcus aureus (BCID) NOT DETECTED NOT DETECTED Final   Staphylococcus epidermidis NOT DETECTED NOT DETECTED Final   Staphylococcus lugdunensis NOT DETECTED NOT DETECTED Final   Streptococcus species NOT DETECTED NOT DETECTED Final   Streptococcus agalactiae NOT DETECTED NOT DETECTED Final   Streptococcus pneumoniae NOT DETECTED NOT DETECTED Final   Streptococcus pyogenes NOT DETECTED NOT DETECTED Final   A.calcoaceticus-baumannii NOT DETECTED NOT DETECTED Final   Bacteroides fragilis NOT DETECTED NOT DETECTED Final   Enterobacterales DETECTED (A) NOT DETECTED Final    Comment: Enterobacterales represent a large order of gram negative bacteria, not a single organism. CRITICAL RESULT CALLED TO, READ BACK BY AND VERIFIED WITH: Sheffield Slider [facility] 08/16/20 0454 JDW    Enterobacter cloacae complex NOT DETECTED NOT DETECTED Final   Escherichia coli DETECTED (A) NOT DETECTED Final    Comment: CRITICAL RESULT CALLED TO, READ BACK BY AND VERIFIED WITH: Sheffield Slider [facility] 08/16/20 0454 JDW    Klebsiella aerogenes NOT DETECTED NOT DETECTED Final   Klebsiella oxytoca NOT DETECTED NOT DETECTED Final   Klebsiella pneumoniae NOT DETECTED NOT DETECTED Final   Proteus species NOT DETECTED NOT DETECTED Final   Salmonella species NOT DETECTED NOT DETECTED Final  Serratia marcescens NOT DETECTED NOT DETECTED Final   Haemophilus influenzae NOT DETECTED NOT DETECTED Final   Neisseria meningitidis NOT DETECTED NOT DETECTED Final   Pseudomonas aeruginosa NOT DETECTED NOT DETECTED Final   Stenotrophomonas maltophilia NOT DETECTED NOT DETECTED Final    Candida albicans NOT DETECTED NOT DETECTED Final   Candida auris NOT DETECTED NOT DETECTED Final   Candida glabrata NOT DETECTED NOT DETECTED Final   Candida krusei NOT DETECTED NOT DETECTED Final   Candida parapsilosis NOT DETECTED NOT DETECTED Final   Candida tropicalis NOT DETECTED NOT DETECTED Final   Cryptococcus neoformans/gattii NOT DETECTED NOT DETECTED Final   CTX-M ESBL DETECTED (A) NOT DETECTED Final    Comment: CRITICAL RESULT CALLED TO, READ BACK BY AND VERIFIED WITH: Sheffield Slider [facility] 08/16/20 0454 JDW (NOTE) Extended spectrum beta-lactamase detected. Recommend a carbapenem as initial therapy.      Carbapenem resistance IMP NOT DETECTED NOT DETECTED Final   Carbapenem resistance KPC NOT DETECTED NOT DETECTED Final   Carbapenem resistance NDM NOT DETECTED NOT DETECTED Final   Carbapenem resist OXA 48 LIKE NOT DETECTED NOT DETECTED Final   Carbapenem resistance VIM NOT DETECTED NOT DETECTED Final    Comment: Performed at [facility], [place] [address], [place], [place] [address]  Urine Culture     Status: Abnormal   Collection Time: 08/15/20  1:29 PM   Specimen: PATH Cytology Urine  Result Value Ref Range Status   Specimen Description   Final    URINE, RANDOM Performed at [place] [address] [place], [place] [address]    Special Requests   Final    NONE Performed at [facility], [place] [address] [place], [place] [address]    Culture (A)  Final    >=100,000 COLONIES/mL ESCHERICHIA COLI Confirmed Extended Spectrum Beta-Lactamase Producer (ESBL).  In bloodstream infections from ESBL organisms, carbapenems are preferred over piperacillin/tazobactam. They are shown to have a lower risk of mortality.    Report Status 08/17/2020 FINAL  Final   Organism ID, Bacteria ESCHERICHIA COLI (A)  Final      Susceptibility   Escherichia coli - MIC*    AMPICILLIN >=32 RESISTANT Resistant     CEFAZOLIN >=64 RESISTANT Resistant      CEFEPIME 1 SENSITIVE Sensitive     CEFTRIAXONE 32 RESISTANT Resistant     CIPROFLOXACIN >=4 RESISTANT Resistant     GENTAMICIN <=1 SENSITIVE Sensitive     IMIPENEM <=0.25 SENSITIVE Sensitive     NITROFURANTOIN 128 RESISTANT Resistant     TRIMETH/SULFA >=320 RESISTANT Resistant     AMPICILLIN/SULBACTAM >=32 RESISTANT Resistant     PIP/TAZO 8 SENSITIVE Sensitive     * >=100,000 COLONIES/mL ESCHERICHIA COLI  Culture, blood (routine x 2)     Status: None (Preliminary result)   Collection Time: 08/16/20  6:35 PM   Specimen: BLOOD  Result Value Ref Range Status   Specimen Description   Final    BLOOD LEFT ANTECUBITAL Performed at [place] [address], [place], [place] [address]    Special Requests   Final    BOTTLES DRAWN AEROBIC AND ANAEROBIC Blood Culture adequate volume Performed at [place] [address], [place], [place] [address]    Culture   Final    NO GROWTH 2 DAYS Performed at [place] [address], [place],  [address]    Report Status PENDING  Incomplete  Culture, blood (routine x 2)  Status: None (Preliminary result)   Collection Time: 08/16/20  6:35 PM   Specimen: BLOOD  Result Value Ref Range Status   Specimen Description   Final    BLOOD RIGHT ANTECUBITAL Performed at [place] [address] [place], [place] [address]    Special Requests   Final    BOTTLES DRAWN AEROBIC AND ANAEROBIC Blood Culture adequate volume Performed at [place] [address] [place], [place] [address]    Culture   Final    NO GROWTH 2 DAYS Performed at [place] [address], [place], [place] [address]    Report Status PENDING  Incomplete     Labs:   CBC: Recent Labs  Lab 08/15/20 0135 08/15/20 0210 08/15/20 0825 08/16/20 0529 08/17/20 0507 08/18/20 0450  WBC 10.0  --  3.0* 12.4* 16.9* 14.4*  NEUTROABS 9.3*  --  2.6  --   --   --   HGB  14.3 14.3 16.0* 12.7 13.1 14.1  HCT 44.7 42.0 50.6* 40.6 40.8 45.4  MCV 89.9  --  89.9 91.0 90.3 90.6  PLT 204  --  192 158 194 390   Basic Metabolic Panel: Recent Labs  Lab 08/15/20 0135 08/15/20 0210 08/15/20 0825 08/16/20 0529 08/17/20 0507 08/18/20 0450  NA 136 137 139 142 139 142  K 3.9 4.0 4.7 5.0 4.6 4.5  CL 101 103 103 108 107 107  CO2 23  --  '22 26 24 29  ' GLUCOSE 118* 110* 132* 168* 151* 109*  BUN '20 19 18 ' 23* 28* 26*  CREATININE 1.62* 1.60* 1.82* 1.42* 1.06* 1.11*  CALCIUM 10.2  --  10.9* 10.7* 10.3 10.5*   Liver Function Tests: Recent Labs  Lab 08/15/20 0135 08/15/20 0825  AST 16 26  ALT 23 25  ALKPHOS 93 108  BILITOT 2.6* 2.5*  2.8*  PROT 6.5 7.8  ALBUMIN 3.8 4.3   BNP (last 3 results) No results for input(s): BNP in the last 8760 hours. Cardiac Enzymes: No results for input(s): CKTOTAL, CKMB, CKMBINDEX, TROPONINI in the last 168 hours. CBG: Recent Labs  Lab 08/15/20 2258  GLUCAP 148*    Urinalysis    Component Value Date/Time   COLORURINE YELLOW 08/15/2020 0320   APPEARANCEUR CLEAR 08/15/2020 0320   LABSPEC 1.006 08/15/2020 0320   PHURINE 6.0 08/15/2020 0320   GLUCOSEU NEGATIVE 08/15/2020 0320   HGBUR MODERATE (A) 08/15/2020 0320   BILIRUBINUR NEGATIVE 08/15/2020 0320   KETONESUR NEGATIVE 08/15/2020 0320   PROTEINUR NEGATIVE 08/15/2020 0320   NITRITE NEGATIVE 08/15/2020 0320   LEUKOCYTESUR NEGATIVE 08/15/2020 0320         Time coordinating discharge: Over 45 minutes  SIGNED: Deatra James, MD, FACP, FHM. [facility],  Please use [email or web address] to Page If 7PM-7AM, please contact night-coverage [email or web address] 08/18/2020, 4:13 PM

## 2020-08-18 NOTE — TOC Transition Note (Signed)
Transition of Care [facility]) - CM/SW Discharge Note   Patient Details  Name: Candace Wright MRN: [id number] Date of Birth: 16-Sep-1960  Transition of Care [facility]) CM/SW Contact:  Lanier Clam, RN Phone Number: 08/18/2020, 4:14 PM   Clinical Narrative: [facility] IV abx infusion rep Pam-completed instruction-awaiting OPAT orders to be signed by MD-notified MD. Anastasia Fiedler to provide HHRN-further instruction. No further CM needs.      Final next level of care: Home w Home Health Services Barriers to Discharge: No Barriers Identified   Patient Goals and CMS Choice Patient states their goals for this hospitalization and ongoing recovery are:: go home CMS Medicare.gov Compare Post Acute Care list provided to:: Patient Represenative (must comment) (spouse [id number] [phone]) Choice offered to / list presented to : [facility]  Discharge Placement                       Discharge Plan and Services   Discharge Planning Services: CM Consult Post Acute Care Choice: Home Health                    HH Arranged: RN,IV Antibiotics HH Agency: [facility] Date HH Agency Contacted: 08/17/20 Time HH Agency Contacted: 1547 Representative spoke with at [facility] Agency: Pam  Social Determinants of Health (SDOH) Interventions     Readmission Risk Interventions No flowsheet data found.

## 2020-08-20 LAB — CULTURE, BLOOD (SINGLE): Culture: NO GROWTH

## 2020-08-21 ENCOUNTER — Other Ambulatory Visit: Payer: Self-pay | Admitting: Urology

## 2020-08-21 LAB — CULTURE, BLOOD (ROUTINE X 2)
Culture: NO GROWTH
Culture: NO GROWTH
Special Requests: ADEQUATE
Special Requests: ADEQUATE

## 2020-08-22 ENCOUNTER — Encounter (HOSPITAL_BASED_OUTPATIENT_CLINIC_OR_DEPARTMENT_OTHER): Payer: Self-pay | Admitting: Urology

## 2020-08-22 ENCOUNTER — Other Ambulatory Visit: Payer: Self-pay

## 2020-08-22 LAB — CULTURE, BLOOD (ROUTINE X 2): Special Requests: ADEQUATE

## 2020-08-22 NOTE — Progress Notes (Addendum)
Spoke w/ via phone for pre-op interview---pt Lab needs dos----     none          Lab results------ekg 08-15-2020 COVID test ------08-26-2020 900 am Arrive at -------700 am 08-28-2020 NPO after MN NO Solid Food.  Clear liquids from MN until---600 am then npo Medications to take morning of surgery -----none Diabetic medication -----n/a Patient Special Instructions -----none Pre-Op special Istructions -----none Patient verbalized understanding of instructions that were given at this phone interview. Patient denies shortness of breath, chest pain, fever, cough at this phone interview.  Spoke with dr singer mda pt had surgery 08-15-2020 [facility] or without problems pt does not need airway assessment with bmi 47.55 prior to 08-28-2020 surgery.  Pt has right arm picc line

## 2020-08-26 ENCOUNTER — Other Ambulatory Visit (HOSPITAL_COMMUNITY)
Admission: RE | Admit: 2020-08-26 | Discharge: 2020-08-26 | Disposition: A | Discharge status: Home / Self Care | Payer: PRIVATE HEALTH INSURANCE | Source: Ambulatory Visit | Attending: Urology | Admitting: Urology

## 2020-08-26 DIAGNOSIS — Z01812 Encounter for preprocedural laboratory examination: Secondary | ICD-10-CM | POA: Diagnosis present

## 2020-08-26 DIAGNOSIS — Z20822 Contact with and (suspected) exposure to covid-19: Secondary | ICD-10-CM | POA: Diagnosis not present

## 2020-08-26 LAB — SARS CORONAVIRUS 2 (TAT 6-24 HRS): SARS Coronavirus 2: NEGATIVE

## 2020-08-28 ENCOUNTER — Ambulatory Visit (HOSPITAL_BASED_OUTPATIENT_CLINIC_OR_DEPARTMENT_OTHER): Payer: No Typology Code available for payment source | Admitting: Anesthesiology

## 2020-08-28 ENCOUNTER — Encounter (HOSPITAL_BASED_OUTPATIENT_CLINIC_OR_DEPARTMENT_OTHER): Payer: Self-pay | Admitting: Urology

## 2020-08-28 ENCOUNTER — Encounter (HOSPITAL_BASED_OUTPATIENT_CLINIC_OR_DEPARTMENT_OTHER)
Admission: RE | Disposition: A | Discharge status: Home / Self Care | Payer: Self-pay | Source: Home / Self Care | Attending: Urology

## 2020-08-28 ENCOUNTER — Ambulatory Visit (HOSPITAL_BASED_OUTPATIENT_CLINIC_OR_DEPARTMENT_OTHER)
Admission: RE | Admit: 2020-08-28 | Discharge: 2020-08-28 | Disposition: A | Discharge status: Home / Self Care | Payer: No Typology Code available for payment source | Attending: Urology | Admitting: Urology

## 2020-08-28 DIAGNOSIS — Z79899 Other long term (current) drug therapy: Secondary | ICD-10-CM | POA: Insufficient documentation

## 2020-08-28 DIAGNOSIS — Z87442 Personal history of urinary calculi: Secondary | ICD-10-CM | POA: Insufficient documentation

## 2020-08-28 DIAGNOSIS — Z886 Allergy status to analgesic agent status: Secondary | ICD-10-CM | POA: Diagnosis not present

## 2020-08-28 DIAGNOSIS — Z87440 Personal history of urinary (tract) infections: Secondary | ICD-10-CM | POA: Diagnosis not present

## 2020-08-28 DIAGNOSIS — N202 Calculus of kidney with calculus of ureter: Secondary | ICD-10-CM | POA: Diagnosis not present

## 2020-08-28 HISTORY — PX: CYSTOSCOPY/URETEROSCOPY/HOLMIUM LASER/STENT PLACEMENT: SHX6546

## 2020-08-28 SURGERY — CYSTOSCOPY/URETEROSCOPY/HOLMIUM LASER/STENT PLACEMENT
Anesthesia: General | Site: Pelvis | Laterality: Left

## 2020-08-28 MED ORDER — ONDANSETRON HCL 4 MG/2ML IJ SOLN
INTRAMUSCULAR | Status: AC
  Filled 2020-08-28: qty 2

## 2020-08-28 MED ORDER — PROPOFOL 10 MG/ML IV BOLUS
INTRAVENOUS | Status: AC
  Filled 2020-08-28: qty 40

## 2020-08-28 MED ORDER — ACETAMINOPHEN 500 MG PO TABS
ORAL_TABLET | ORAL | Status: AC
  Filled 2020-08-28: qty 2

## 2020-08-28 MED ORDER — DEXAMETHASONE SODIUM PHOSPHATE 10 MG/ML IJ SOLN
INTRAMUSCULAR | Status: DC | PRN
  Administered 2020-08-28: 10 mg via INTRAVENOUS

## 2020-08-28 MED ORDER — DEXAMETHASONE SODIUM PHOSPHATE 10 MG/ML IJ SOLN
INTRAMUSCULAR | Status: AC
  Filled 2020-08-28: qty 1

## 2020-08-28 MED ORDER — FENTANYL CITRATE (PF) 100 MCG/2ML IJ SOLN: 25.0000 ug | INTRAMUSCULAR | Status: DC | PRN

## 2020-08-28 MED ORDER — IOHEXOL 300 MG/ML  SOLN
INTRAMUSCULAR | Status: DC | PRN
  Administered 2020-08-28: 6 mL via URETHRAL

## 2020-08-28 MED ORDER — ACETAMINOPHEN 10 MG/ML IV SOLN
INTRAVENOUS | Status: DC | PRN
  Administered 2020-08-28: 1000 mg via INTRAVENOUS

## 2020-08-28 MED ORDER — ONDANSETRON HCL 4 MG/2ML IJ SOLN
INTRAMUSCULAR | Status: DC | PRN
  Administered 2020-08-28: 4 mg via INTRAVENOUS

## 2020-08-28 MED ORDER — FENTANYL CITRATE (PF) 100 MCG/2ML IJ SOLN
INTRAMUSCULAR | Status: AC
  Filled 2020-08-28: qty 2

## 2020-08-28 MED ORDER — ACETAMINOPHEN 500 MG PO TABS
1000.0000 mg | ORAL_TABLET | Freq: Once | ORAL | Status: AC
  Administered 2020-08-28: 1000 mg via ORAL

## 2020-08-28 MED ORDER — LIDOCAINE 2% (20 MG/ML) 5 ML SYRINGE
INTRAMUSCULAR | Status: DC | PRN
  Administered 2020-08-28: 80 mg via INTRAVENOUS

## 2020-08-28 MED ORDER — PROPOFOL 10 MG/ML IV BOLUS
INTRAVENOUS | Status: DC | PRN
  Administered 2020-08-28: 100 mg via INTRAVENOUS

## 2020-08-28 MED ORDER — PROMETHAZINE HCL 25 MG/ML IJ SOLN
6.2500 mg | INTRAMUSCULAR | Status: DC | PRN
Start: 2020-08-28 — End: 2020-08-28

## 2020-08-28 MED ORDER — KETOROLAC TROMETHAMINE 30 MG/ML IJ SOLN
INTRAMUSCULAR | Status: AC
  Filled 2020-08-28: qty 1

## 2020-08-28 MED ORDER — MIDAZOLAM HCL 5 MG/5ML IJ SOLN
INTRAMUSCULAR | Status: DC | PRN
  Administered 2020-08-28: 2 mg via INTRAVENOUS

## 2020-08-28 MED ORDER — LIDOCAINE HCL (PF) 2 % IJ SOLN
INTRAMUSCULAR | Status: AC
  Filled 2020-08-28: qty 5

## 2020-08-28 MED ORDER — MIDAZOLAM HCL 2 MG/2ML IJ SOLN
INTRAMUSCULAR | Status: AC
  Filled 2020-08-28: qty 2

## 2020-08-28 MED ORDER — ACETAMINOPHEN 10 MG/ML IV SOLN
INTRAVENOUS | Status: AC
  Filled 2020-08-28: qty 100

## 2020-08-28 MED ORDER — OXYCODONE HCL 5 MG/5ML PO SOLN: 5.0000 mg | Freq: Once | ORAL | Status: DC | PRN

## 2020-08-28 MED ORDER — LACTATED RINGERS IV SOLN: INTRAVENOUS | Status: DC

## 2020-08-28 MED ORDER — SODIUM CHLORIDE 0.9 % IR SOLN
Status: DC | PRN
  Administered 2020-08-28: 3000 mL

## 2020-08-28 MED ORDER — OXYCODONE HCL 5 MG PO TABS: 5.0000 mg | ORAL_TABLET | Freq: Once | ORAL | Status: DC | PRN

## 2020-08-28 MED ORDER — AMISULPRIDE (ANTIEMETIC) 5 MG/2ML IV SOLN: 10.0000 mg | Freq: Once | INTRAVENOUS | Status: DC | PRN

## 2020-08-28 MED ORDER — FENTANYL CITRATE (PF) 100 MCG/2ML IJ SOLN
INTRAMUSCULAR | Status: DC | PRN
  Administered 2020-08-28 (×4): 25 ug via INTRAVENOUS

## 2020-08-28 MED ORDER — SODIUM CHLORIDE 0.9 % IV SOLN
2.0000 g | INTRAVENOUS | Status: AC
  Administered 2020-08-28: 2 g via INTRAVENOUS
  Filled 2020-08-28: qty 2

## 2020-08-28 SURGICAL SUPPLY — 20 items
BAG DRAIN URO-CYSTO SKYTR STRL (DRAIN) ×2 IMPLANT
BAG DRN UROCATH (DRAIN) ×1
CATH INTERMIT  6FR 70CM (CATHETERS) IMPLANT
CLOTH BEACON ORANGE TIMEOUT ST (SAFETY) ×2 IMPLANT
FIBER LASER FLEXIVA 365 (UROLOGICAL SUPPLIES) IMPLANT
GLOVE SURG ENC MOIS LTX SZ7.5 (GLOVE) ×2 IMPLANT
GOWN STRL REUS W/TWL XL LVL3 (GOWN DISPOSABLE) ×2 IMPLANT
GUIDEWIRE STR DUAL SENSOR (WIRE) ×4 IMPLANT
INFUSOR MANOMETER BAG 3000ML (MISCELLANEOUS) ×2 IMPLANT
IV NS IRRIG 3000ML ARTHROMATIC (IV SOLUTION) ×4 IMPLANT
KIT TURNOVER CYSTO (KITS) ×2 IMPLANT
MANIFOLD NEPTUNE II (INSTRUMENTS) ×2 IMPLANT
NS IRRIG 500ML POUR BTL (IV SOLUTION) ×2 IMPLANT
PACK CYSTO (CUSTOM PROCEDURE TRAY) ×2 IMPLANT
SHEATH URETERAL 12FRX35CM (MISCELLANEOUS) ×2 IMPLANT
STENT URET 6FRX24 CONTOUR (STENTS) ×2 IMPLANT
TRACTIP FLEXIVA PULS ID 200XHI (Laser) ×1 IMPLANT
TRACTIP FLEXIVA PULSE ID 200 (Laser) ×2
TUBE CONNECTING 12X1/4 (SUCTIONS) ×2 IMPLANT
TUBING UROLOGY SET (TUBING) IMPLANT

## 2020-08-28 NOTE — Discharge Instructions (Addendum)
[facility] [phone] Post Ureteroscopy With Stent Instructions  Definitions:  Ureter: The duct that transports urine from the kidney to the bladder. Stent:   A plastic hollow tube that is placed into the ureter, from the kidney to the bladder to prevent the ureter from swelling shut.  GENERAL INSTRUCTIONS:  Despite the fact that no skin incisions were used, the area around the ureter and bladder is raw and irritated. The stent is a foreign body which will further irritate the bladder wall. This irritation is manifested by increased frequency of urination, both day and night, and by an increase in the urge to urinate. In some, the urge to urinate is present almost always. Sometimes the urge is strong enough that you may not be able to stop yourself from urinating. The only real cure is to remove the stent and then give time for the bladder wall to heal which can't be done until the danger of the ureter swelling shut has passed, which varies.  You may see some blood in your urine while the stent is in place and a few days afterwards. Do not be alarmed, even if the urine was clear for a while. Get off your feet and drink lots of fluids until clearing occurs. If you start to pass clots or don't improve, call us.  DIET: You may return to your normal diet immediately. Because of the raw surface of your bladder, alcohol, spicy foods, acid type foods and drinks with caffeine may cause irritation or frequency and should be used in moderation. To keep your urine flowing freely and to avoid constipation, drink plenty of fluids during the day ( 8-10 glasses ). Tip: Avoid cranberry juice because it is very acidic.  ACTIVITY: Your physical activity doesn't need to be restricted. However, if you are very active, you may see some blood in your urine. We suggest that you reduce your activity under these circumstances until the bleeding has stopped.  BOWELS: It is important to keep your  bowels regular during the postoperative period. Straining with bowel movements can cause bleeding. A bowel movement every other day is reasonable. Use a mild laxative if needed, such as Milk of Magnesia 2-3 tablespoons, or 2 Dulcolax tablets. Call if you continue to have problems. If you have been taking narcotics for pain, before, during or after your surgery, you may be constipated. Take a laxative if necessary.   MEDICATION: You should resume your pre-surgery medications unless told not to. You may take oxybutynin or flomax if prescribed for bladder spasms or discomfort from the stent Take pain medication as directed for pain refractory to conservative management  PROBLEMS YOU SHOULD REPORT TO [place]:  Fevers over 100.5 Fahrenheit.  Heavy bleeding, or clots ( See above notes about blood in urine ).  Inability to urinate.  Drug reactions ( hives, rash, nausea, vomiting, diarrhea ).  Severe burning or pain with urination that is not improving.  Follow-up in 1 week for stent removal  Post Anesthesia Home Care Instructions  Activity: Get plenty of rest for the remainder of the day. A responsible individual must stay with you for 24 hours following the procedure.  For the next 24 hours, DO NOT: -Drive a car -Advertising copywriter -Drink alcoholic beverages -Take any medication unless instructed by your physician -Make any legal decisions or sign important papers.  Meals: Start with liquid foods such as gelatin or soup. Progress to regular foods as tolerated. Avoid greasy, spicy, heavy foods. If nausea and/or vomiting  occur, drink only clear liquids until the nausea and/or vomiting subsides. Call your physician if vomiting continues.  Special Instructions/Symptoms: Your throat may feel dry or sore from the anesthesia or the breathing tube placed in your throat during surgery. If this causes discomfort, gargle with warm salt water. The discomfort should disappear within 24 hours.  If you  had a scopolamine patch placed behind your ear for the management of post- operative nausea and/or vomiting:  1. The medication in the patch is effective for 72 hours, after which it should be removed.  Wrap patch in a tissue and discard in the trash. Wash hands thoroughly with soap and water. 2. You may remove the patch earlier than 72 hours if you experience unpleasant side effects which may include dry mouth, dizziness or visual disturbances. 3. Avoid touching the patch. Wash your hands with soap and water after contact with the patch.

## 2020-08-28 NOTE — Progress Notes (Signed)
Pharmacy Antibiotic Note  Candace Wright is a 60 y.o. female admitted on 08/28/2020 with ESBL E coli urosepsis/bacteremia.  Pharmacy has been consulted for  Dosing. On Ertapenem PTA 1 gm IV q24 with planned end date 09/01/2020  Plan: Meropenem 2 gm IV x 1 prior to cysto with stent placement outpt procedure Pharmacy to sign off  Height: 5\' 2"  (157.5 cm) Weight: 113.8 kg (250 lb 12.8 oz) IBW/kg (Calculated) : 50.1  Temp (24hrs), Avg:98.2 F (36.8 C), Min:98.2 F (36.8 C), Max:98.2 F (36.8 C)  No results for input(s): WBC, CREATININE, LATICACIDVEN, VANCOTROUGH, VANCOPEAK, VANCORANDOM, GENTTROUGH, GENTPEAK, GENTRANDOM, TOBRATROUGH, TOBRAPEAK, TOBRARND, AMIKACINPEAK, AMIKACINTROU, AMIKACIN in the last 168 hours.  Estimated Creatinine Clearance: 65.9 mL/min (A) (by C-G formula based on SCr of 1.11 mg/dL (H)).    Allergies  Allergen Reactions  . Ibuprofen Other (See Comments)    "Funny feeling in my head"    Thank you for allowing pharmacy to be a part of this patient's care.  , Pharm.D 08/28/2020 7:38 AM

## 2020-08-28 NOTE — Anesthesia Procedure Notes (Signed)
Procedure Name: LMA Insertion Date/Time: 08/28/2020 8:57 AM Performed by: Briant Sites, CRNA Pre-anesthesia Checklist: Patient identified, Emergency Drugs available, Suction available and Patient being monitored Patient Re-evaluated:Patient Re-evaluated prior to induction Oxygen Delivery Method: Circle system utilized Preoxygenation: Pre-oxygenation with 100% oxygen Induction Type: IV induction Ventilation: Mask ventilation without difficulty LMA: LMA inserted LMA Size: 4.0 Number of attempts: 1 Airway Equipment and Method: Bite block Placement Confirmation: positive ETCO2 Tube secured with: Tape Dental Injury: Teeth and Oropharynx as per pre-operative assessment

## 2020-08-28 NOTE — Transfer of Care (Signed)
Immediate Anesthesia Transfer of Care Note  Patient: Candace Wright  Procedure(s) Performed: CYSTOSCOPY LEFT URETEROSCOPY/HOLMIUM LASER/STENT EXCHANGE (Left Pelvis)  Patient Location: PACU  Anesthesia Type:General  Level of Consciousness: awake, alert  and oriented  Airway & Oxygen Therapy: Patient Spontanous Breathing and Patient connected to nasal cannula oxygen  Post-op Assessment: Report given to RN  Post vital signs: Reviewed and stable  Last Vitals:  Vitals Value Taken Time  BP 120/69 08/28/20 0938  Temp    Pulse 76 08/28/20 0939  Resp 11 08/28/20 0939  SpO2 95 % 08/28/20 0939  Vitals shown include unvalidated device data.  Last Pain:  Vitals:   08/28/20 0722  TempSrc: Oral  PainSc: 0-No pain      Patients Stated Pain Goal: 5 (08/28/20 4098)  Complications: No complications documented.

## 2020-08-28 NOTE — Op Note (Signed)
Operative Note  Preoperative diagnosis:  1.  Left ureteral calculus  Postoperative diagnosis: 1.  Left ureteral calculus  Procedure(s): 1.  Cystoscopy with left retrograde pyelogram, left ureteroscopy with laser lithotripsy, ureteral stent exchange  Surgeon: Modena Slater, MD  Assistants: None  Anesthesia: General  Complications: None immediate  EBL: Minimal  Specimens: 1.  None  Drains/Catheters: 1.  6 x 24 double-J ureteral stent  Intraoperative findings: 1.  Normal urethra and bladder 2.  Approximately 1 cm proximal left ureteral calculus that was fragmented to tiny fragments 3.  No hydronephrosis on retrograde pyelogram.  No filling defects.  Indication: 60 year old female status post urgent ureteral stent placement for an obstructing stone and sepsis presents for the previously mentioned operation.  Description of procedure:  The patient was identified and consent was obtained.  The patient was taken to the operating room and placed in the supine position.  The patient was placed under general anesthesia.  Perioperative antibiotics were administered.  The patient was placed in dorsal lithotomy.  Patient was prepped and draped in a standard sterile fashion and a timeout was performed.  A 21 French cystoscope was advanced into the urethra and into the bladder.  Complete cystoscopy was performed with no abnormal findings.  The stent was grasped and pulled just beyond the urethral meatus.  A wire was advanced through this and up to the kidney under fluoroscopic guidance.  The stent was withdrawn.  A 12 x 14 access sheath was advanced over the wire under continuous fluoroscopic guidance.  The inner sheath was withdrawn and a second wire was advanced through the sheath up to the kidney under fluoroscopic guidance followed by withdrawal of the access sheath.  1 the wires was secured to the drape and the other wire was used to advance the 12 x 14 access sheath over the wire under  continuous fluoroscopic guidance up to the level of the stone.  The inner sheath along with the wire were withdrawn.  Digital ureteroscopy identified the stone of interest which was retropulsed into the kidney followed by laser fragmentation on dust settings.  Once only tiny fragments remained, complete pyeloscopy was performed and one other fragment/stone was dusted to tiny fragments.  There were then no other clinically significant stone fragments.  I shot a retrograde pyelogram through the scope with findings noted above.  I then withdrew the scope along with the access sheath visualizing the entire ureter upon removal.  There were no ureteral calculi and no obvious ureteral injury identified.  I backloaded the wire onto a rigid cystoscope and advanced that into the bladder followed by routine placement of a 6 x 24 double-J ureteral stent.  Fluoroscopy confirmed proximal placement and direct visualization confirmed a good coil within the bladder.  I drained the bladder and withdrew the scope.  Patient tolerated the procedure well and was stable postoperatively.  Plan: Follow-up in 1 week for stent removal

## 2020-08-28 NOTE — H&P (Signed)
CC/HPI: cc: ureteral calculus   08/14/20: 60 year old woman with a history of nephrolithiasis as well as ESBL UTIs found to have 9 mm ureteral calculus seen on CT scan. Patient presents to the ED on 08/12/2020 with left-sided abdominal pain found to have 9 mm left UPJ calculus with moderate hydronephrosis. Patient denies any fever, chills, nausea or vomiting. Her pain is well controlled with oral pain medication. Her urinalysis in the emergency room was not concerning for infection.     ALLERGIES: Advil Ibuprofen    MEDICATIONS: Tamsulosin Hcl 0.4 mg capsule 1 capsule PO Q HS  Claritin  Hydrocodone-Acetaminophen 5 mg-325 mg tablet 1 tablet PO Q 6 H  Multivitamin  Ondansetron Hcl 4 mg tablet 1 tablet PO Q 4 H  Premarin 0.625 mg/gram cream with applicator Apply pea sized amount per vagina every night for 2 weeks, then 2-3 nights per week thereafter     GU PSH: None   NON-GU PSH: Ankle Arthroscopy/surgery Bilateral Tubal Ligation, 1994     GU PMH: Chronic cystitis (w/o hematuria) - 08/03/2020, - 06/08/2020, - 05/30/2020, She has a chronic ESBL E coli cystitis without any abnormality of the upper tract. She does have systemic symptoms of low back pain but no flank pain. My recommendation was 1st to re-culture her urine and then placed her on a 4 week course of nitrofurantoin. Once she completes this course of antibiotics I am going to reculture her urine 1 week following that to see if it has achieved sterility., - 10/25/2019 Renal calculus - 08/03/2020, She has a small, nonobstructing renal calculus., - 10/25/2019 Urge incontinence - 08/03/2020, - 06/20/2020, - 06/12/2020 Stress Incontinence - 06/20/2020, - 06/12/2020 Urinary Frequency - 06/20/2020, - 06/12/2020 Urinary Urgency - 06/20/2020, - 06/12/2020 Postmenopausal atrophic vaginitis - 06/08/2020, - 05/30/2020 Ureteral calculus (Worsening), Left, Her stone is causing her significant symptoms and we therefore discussed proceeding with treatment.  She has elected to proceed with ureteroscopic management. This will be scheduled within the week. - 2017, Left, We discussed the management of urinary stones. These options include observation, ureteroscopy, shockwave lithotripsy, and PCNL. We discussed which options are relevant to these particular stones. We discussed the natural history of stones as well as the complications of untreated stones and the impact on quality of life without treatment as well as with each of the above listed treatments. We also discussed the efficacy of each treatment in its ability to clear the stone burden. With any of these management options I discussed the signs and symptoms of infection and the need for emergent treatment should these be experienced. For each option we discussed the ability of each procedure to clear the patient of their stone burden. For observation I described the risks which include but are not limited to silent renal damage, life-threatening infection, need for emergent surgery, failure to pass stone, and pain. For ureteroscopy I described the risks which include heart attack, stroke, pulmonary embolus, death, bleeding, infection, damage to contiguous structures, positioning injury, ureteral stricture, ureteral avulsion, ureteral injury, need for ureteral stent, inability to perform ureteroscopy, need for an interval procedure, inability to clear stone burden, stent discomfort and pain. For shockwave lithotripsy I described the risks which include arrhythmia, kidney contusion, kidney hemorrhage, need for transfusion, long-term risk of diabetes or hypertension, back discomfort, flank ecchymosis, flank abrasion, inability to break up stone, inability to pass stone fragments, Steinstrasse, infection associated with obstructing stones, need for different surgical procedure and possible need for repeat shockwave lithotripsy. , - 2017 Ureteral  obstruction secondary to calculous, Left, she does have some mild  hydronephrosis on her CT scan. - 2017    NON-GU PMH: Muscle weakness (generalized) - 06/20/2020, - 06/12/2020 Other muscle spasm - 06/20/2020, - 06/12/2020 Other specified disorders of muscle - 06/20/2020, - 06/12/2020    FAMILY HISTORY: 1 Daughter - Daughter 1 son - Son Colon Cancer - Mother COPD (chronic obstructive pulmonary disease) with - Father   SOCIAL HISTORY: Marital Status: Married Preferred Language: English; Ethnicity: Not Hispanic Or Latino; Race: White Current Smoking Status: Patient has never smoked.  Has never drank.  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    REVIEW OF SYSTEMS:    GU Review Female:   Patient denies frequent urination, hard to postpone urination, burning /pain with urination, get up at night to urinate, leakage of urine, stream starts and stops, trouble starting your stream, have to strain to urinate, and being pregnant.  Gastrointestinal (Upper):   Patient denies nausea, vomiting, and indigestion/ heartburn.  Gastrointestinal (Lower):   Patient denies diarrhea and constipation.  Constitutional:   Patient denies fever, night sweats, weight loss, and fatigue.  Skin:   Patient denies skin rash/ lesion and itching.  Eyes:   Patient denies blurred vision and double vision.  Ears/ Nose/ Throat:   Patient denies sore throat and sinus problems.  Hematologic/Lymphatic:   Patient denies swollen glands and easy bruising.  Cardiovascular:   Patient denies leg swelling and chest pains.  Respiratory:   Patient denies cough and shortness of breath.  Endocrine:   Patient denies excessive thirst.  Musculoskeletal:   Patient denies back pain and joint pain.  Neurological:   Patient denies headaches and dizziness.  Psychologic:   Patient denies depression and anxiety.   VITAL SIGNS:      08/14/2020 09:54 AM  Weight 260 lb / 117.93 kg  Height 62 in / 157.48 cm  BP 107/57 mmHg  Pulse 104 /min  Temperature 99.1 F / 37.2 C  BMI 47.5 kg/m   GU PHYSICAL  EXAMINATION:      Notes: No CVA tenderness bilaterally.   MULTI-SYSTEM PHYSICAL EXAMINATION:    Constitutional: Well-nourished. No physical deformities. Normally developed. Good grooming.  Neck: Neck symmetrical, not swollen. Normal tracheal position.  Respiratory: No labored breathing, no use of accessory muscles.   Skin: No paleness, no jaundice, no cyanosis. No lesion, no ulcer, no rash.  Neurologic / Psychiatric: Oriented to time, oriented to place, oriented to person. No depression, no anxiety, no agitation.  Gastrointestinal: No rigidity  Eyes: Normal conjunctivae. Normal eyelids.  Ears, Nose, Mouth, and Throat: Left ear no scars, no lesions, no masses. Right ear no scars, no lesions, no masses. Nose no scars, no lesions, no masses. Normal hearing. Normal lips.  Musculoskeletal: Normal gait and station of head and neck.     Complexity of Data:  Records Review:   Previous Patient Records, POC Tool  Urine Test Review:   Urinalysis  X-Ray Review: KUB: Reviewed Films. Discussed With Patient. Calcification seen in expected location of left left proximal ureter C.T. Abdomen/Pelvis: Reviewed Films. Reviewed Report. Discussed With Patient.    Notes:                     12/28/2019: BUN 9, creatinine 0.80   PROCEDURES:         KUB - 16109  A single view of the abdomen is obtained.      . Patient confirmed No Neulasta OnPro Device.     ASSESSMENT:  ICD-10 Details  1 GU:   Ureteral calculus - N20.1 Acute, Uncomplicated - Discussed management option of left ureteral calculus including medical expulsive therapy, ESWL, and ureteroscopy with laser lithotripsy. Patient has elected to proceed with ESWL. She is not on any blood thinners and was counseled to stop taking these leading up to the procedure. We did discuss that she may some her require another intervention if the ESWL as unsuccessful. The risks and benefits of the procedure were discussed with the patient including but not  limited to bleeding, pain, hematoma, infection, damage to the kidney and surrounding structures, need for future procedures, need for staged procedure, inability to our break up stone. Patient will be scheduled for the next available date. Pain medication was sent to her pharmacy to get her through to the surgery date she was counseled to call the office or present to the ER if she develops high fevers and chills.  2   Renal calculus - N20.0 Chronic, Stable - Patient also with 4 mm nonobstructing left lower pole calculus. This can be observe for now based on size   PLAN:            Medications New Meds: Hydrocodone-Acetaminophen 5 mg-325 mg tablet 1 tablet PO Q 6 H PRN severe kidney stone pain  #20  0 Refill(s)            Orders X-Rays: KUB          Schedule         Document Letter(s):  Created for Patient: Clinical Summary         Notes:   cc: Maurice Small, MD  cc: Charlton Haws, MD        Next Appointment:      Next Appointment: 11/02/2020 07:45 AM    Appointment Type: Office Visit Established Patient    Location: [facility] [phone]    Provider: Jettie Pagan, M.D.    Reason for Visit: 3 mo ov     Signed by Kasandra Knudsen, M.D. on 08/14/20 at 12:47

## 2020-08-28 NOTE — Anesthesia Postprocedure Evaluation (Signed)
Anesthesia Post Note  Patient: Candace Wright  Procedure(s) Performed: CYSTOSCOPY LEFT URETEROSCOPY/HOLMIUM LASER/STENT EXCHANGE (Left Pelvis)     Patient location during evaluation: PACU Anesthesia Type: General Level of consciousness: awake Pain management: pain level controlled Vital Signs Assessment: post-procedure vital signs reviewed and stable Respiratory status: spontaneous breathing and respiratory function stable Cardiovascular status: stable Postop Assessment: no apparent nausea or vomiting Anesthetic complications: no   No complications documented.  Last Vitals:  Vitals:   08/28/20 1000 08/28/20 1015  BP: 122/64   Pulse: 70 (!) 59  Resp: 14 (!) 9  Temp:    SpO2: 94% 95%    Last Pain:  Vitals:   08/28/20 1000  TempSrc:   PainSc: 0-No pain                 Mellody Dance

## 2020-08-28 NOTE — Anesthesia Preprocedure Evaluation (Addendum)
Anesthesia Evaluation  Patient identified by MRN, date of birth, ID band Patient awake    Reviewed: Allergy & Precautions, H&P , NPO status , Patient's Chart, lab work & pertinent test results  Airway Mallampati: II  TM Distance: >3 FB Neck ROM: Full    Dental no notable dental hx.    Pulmonary neg pulmonary ROS,    Pulmonary exam normal breath sounds clear to auscultation       Cardiovascular negative cardio ROS Normal cardiovascular exam Rhythm:Regular Rate:Normal     Neuro/Psych negative neurological ROS  negative psych ROS   GI/Hepatic Neg liver ROS, GERD  ,  Endo/Other  Morbid obesity  Renal/GU Renal disease (kidney stones)  negative genitourinary   Musculoskeletal negative musculoskeletal ROS (+)   Abdominal (+) + obese,   Peds negative pediatric ROS (+)  Hematology negative hematology ROS (+)   Anesthesia Other Findings   Reproductive/Obstetrics negative OB ROS                             Anesthesia Physical Anesthesia Plan  ASA: III  Anesthesia Plan: General   Post-op Pain Management:    Induction: Intravenous  PONV Risk Score and Plan: 3 and Ondansetron, Dexamethasone and Treatment may vary due to age or medical condition  Airway Management Planned: LMA  Additional Equipment:   Intra-op Plan:   Post-operative Plan: Extubation in OR  Informed Consent: I have reviewed the patients History and Physical, chart, labs and discussed the procedure including the risks, benefits and alternatives for the proposed anesthesia with the patient or authorized representative who has indicated his/her understanding and acceptance.     Dental advisory given  Plan Discussed with: CRNA, Anesthesiologist and Surgeon  Anesthesia Plan Comments:         Anesthesia Quick Evaluation

## 2020-08-28 NOTE — Interval H&P Note (Signed)
History and Physical Interval Note:  08/28/2020 8:53 AM  Candace Wright  has presented today for surgery, with the diagnosis of LEFT URETERAL STONE.  The various methods of treatment have been discussed with the patient and family. After consideration of risks, benefits and other options for treatment, the patient has consented to  Procedure(s): CYSTOSCOPY LEFT URETEROSCOPY/HOLMIUM LASER/STENT PLACEMENT (Left) as a surgical intervention.  The patient's history has been reviewed, patient examined, no change in status, stable for surgery.  I have reviewed the patient's chart and labs.  Questions were answered to the patient's satisfaction.     Ray Church, III

## 2020-08-29 ENCOUNTER — Other Ambulatory Visit: Payer: Self-pay

## 2020-08-29 ENCOUNTER — Ambulatory Visit (INDEPENDENT_AMBULATORY_CARE_PROVIDER_SITE_OTHER): Payer: PRIVATE HEALTH INSURANCE | Admitting: Internal Medicine

## 2020-08-29 ENCOUNTER — Encounter (HOSPITAL_BASED_OUTPATIENT_CLINIC_OR_DEPARTMENT_OTHER): Payer: Self-pay | Admitting: Urology

## 2020-08-29 VITALS — BP 167/84 | HR 103 | Temp 98.0°F | Ht 64.0 in | Wt 253.0 lb

## 2020-08-29 DIAGNOSIS — N201 Calculus of ureter: Secondary | ICD-10-CM

## 2020-08-29 DIAGNOSIS — R7881 Bacteremia: Secondary | ICD-10-CM

## 2020-08-29 DIAGNOSIS — Z1612 Extended spectrum beta lactamase (ESBL) resistance: Secondary | ICD-10-CM

## 2020-08-29 DIAGNOSIS — Z5181 Encounter for therapeutic drug level monitoring: Secondary | ICD-10-CM | POA: Diagnosis not present

## 2020-08-29 DIAGNOSIS — B9620 Unspecified Escherichia coli [E. coli] as the cause of diseases classified elsewhere: Secondary | ICD-10-CM

## 2020-08-29 DIAGNOSIS — A499 Bacterial infection, unspecified: Secondary | ICD-10-CM | POA: Diagnosis not present

## 2020-08-29 NOTE — Progress Notes (Signed)
Call placed to [facility] and spoke [facility] and gave verbal orders per Dr. Earlene Plater to pull picc line at the end of therapy on 09/01/20. Verbal order read back and understood.  Valarie Cones

## 2020-08-29 NOTE — Progress Notes (Signed)
[place] for Infectious Disease  Reason for Consult: Hospital follow up ESBL bacteremia  Referring Provider: Hospital follow up   HPI:    Candace Wright is a 60 y.o. female with PMHx as below who presents to the clinic for hospital follow up for ESBL bacteremia.   Patient was hospitalized recently from 08/15/2020 to 08/18/2020 after presenting with sepsis secondary to obstructing kidney stone who underwent urgent ureteral stent placement during that admission.  Her blood cultures were positive for ESBL E. coli as were her urine culture taken from stent placement procedure.  She had a PICC line placed and was discharged home on ertapenem 1 g every 24 hours through 09/01/2020.  On 08/28/2020 she followed up with urology and underwent cystoscopy with left retrograde pyelogram, left ureteroscopy with laser lithotripsy, and ureteral stent exchange.  She continues on ertapenem and plans to follow-up with the urology office in 1 week for stent removal.  There were no complications from her procedure this week.  I have her reviewed her most recent home health labs which revealed WBC 9.7, normal creatinine, normal electrolytes.  She presents today for follow-up.  Patient's Medications  New Prescriptions   No medications on file  Previous Medications   ACETAMINOPHEN (TYLENOL) 325 MG TABLET    Take 2 tablets (650 mg total) by mouth every 6 (six) hours as needed for mild pain (or Fever >/= 101).   ERTAPENEM (INVANZ) IVPB    Inject 1 g into the vein daily for 14 days. Indication:  ESBL Bacteremia First Dose: No Last Day of Therapy:  09/01/2020 Labs - Once weekly:  CBC/D and BMP, Labs - Every other week:  ESR and CRP Method of administration: Mini-Bag Plus / Gravity Method of administration may be changed at the discretion of home infusion pharmacist based upon assessment of the patient and/or caregiver's ability to self-administer the medication ordered.   HYDROCODONE-ACETAMINOPHEN (NORCO)  5-325 MG TABLET    Take 1 tablet by mouth every 6 (six) hours as needed for moderate pain.   ONDANSETRON (ZOFRAN) 4 MG TABLET    Take 1 tablet (4 mg total) by mouth every 6 (six) hours as needed for nausea.   TAMSULOSIN (FLOMAX) 0.4 MG CAPS CAPSULE    Take 1 capsule (0.4 mg total) by mouth daily.  Modified Medications   No medications on file  Discontinued Medications   No medications on file      Past Medical History:  Diagnosis Date  . GERD (gastroesophageal reflux disease)   . Left ureteral stone   . Wears glasses     Social History   Tobacco Use  . Smoking status: Never Smoker  . Smokeless tobacco: Never Used  Vaping Use  . Vaping Use: Never used  Substance Use Topics  . Alcohol use: No  . Drug use: No    No family history on file.  Allergies  Allergen Reactions  . Ibuprofen Other (See Comments)    "Funny feeling in my head"    Review of Systems  Constitutional: Negative for chills and fever.  Gastrointestinal: Negative.   Genitourinary: Negative for dysuria.  Musculoskeletal: Negative for back pain.      OBJECTIVE:    Vitals:   08/29/20 1452  BP: (!) 167/84  Pulse: (!) 103  Temp: 98 F (36.7 C)  Weight: 253 lb (114.8 kg)  Height: '5\' 4"'  (1.626 m)     Body mass index is 43.43 kg/m.  Physical Exam Constitutional:  General: She is not in acute distress.    Appearance: Normal appearance.  Abdominal:     Tenderness: There is no right CVA tenderness or left CVA tenderness.  Skin:    General: Skin is warm and dry.     Comments: Picc in place  Neurological:     General: No focal deficit present.     Mental Status: She is alert and oriented to person, place, and time.      Labs and Microbiology:  CBC Latest Ref Rng & Units 08/18/2020 08/17/2020 08/16/2020  WBC 4.0 - 10.5 K/uL 14.4(H) 16.9(H) 12.4(H)  Hemoglobin 12.0 - 15.0 g/dL 14.1 13.1 12.7  Hematocrit 36.0 - 46.0 % 45.4 40.8 40.6  Platelets 150 - 400 K/uL 223 194 158   CMP Latest Ref  Rng & Units 08/18/2020 08/17/2020 08/16/2020  Glucose 70 - 99 mg/dL 109(H) 151(H) 168(H)  BUN 6 - 20 mg/dL 26(H) 28(H) 23(H)  Creatinine 0.44 - 1.00 mg/dL 1.11(H) 1.06(H) 1.42(H)  Sodium 135 - 145 mmol/L 142 139 142  Potassium 3.5 - 5.1 mmol/L 4.5 4.6 5.0  Chloride 98 - 111 mmol/L 107 107 108  CO2 22 - 32 mmol/L '29 24 26  ' Calcium 8.9 - 10.3 mg/dL 10.5(H) 10.3 10.7(H)  Total Protein 6.5 - 8.1 g/dL - - -  Total Bilirubin 0.3 - 1.2 mg/dL - - -  Alkaline Phos 38 - 126 U/L - - -  AST 15 - 41 U/L - - -  ALT 0 - 44 U/L - - -     Recent Results (from the past 240 hour(s))  SARS CORONAVIRUS 2 (TAT 6-24 HRS) Nasopharyngeal Nasopharyngeal Swab     Status: None   Collection Time: 08/26/20  9:10 AM   Specimen: Nasopharyngeal Swab  Result Value Ref Range Status   SARS Coronavirus 2 NEGATIVE NEGATIVE Final    Comment: (NOTE) SARS-CoV-2 target nucleic acids are NOT DETECTED.  The SARS-CoV-2 RNA is generally detectable in upper and lower respiratory specimens during the acute phase of infection. Negative results do not preclude SARS-CoV-2 infection, do not rule out co-infections with other pathogens, and should not be used as the sole basis for treatment or other patient management decisions. Negative results must be combined with clinical observations, patient history, and epidemiological information. The expected result is Negative.  Fact Sheet for Patients: SugarRoll.be  Fact Sheet for Healthcare Providers: https://www.woods-mathews.com/  This test is not yet approved or cleared by the [place] FDA and  has been authorized for detection and/or diagnosis of SARS-CoV-2 by FDA under an Emergency Use Authorization (EUA). This EUA will remain  in effect (meaning this test can be used) for the duration of the COVID-19 declaration under Se ction 564(b)(1) of the Act, 21 U.S.C. section 360bbb-3(b)(1), unless the authorization is terminated or revoked  sooner.  Performed at [facility], [place] [address], [place], [place] [address]      ASSESSMENT & PLAN:    E coli bacteremia She is doing well in Ertapenem 1gm daily via PICC line for bacteremia 2/2 ESBL E coli from an obstructing stone.  She underwent lithotripsy with urology yesterday for definitive stone management and will continue her antibiotics through 09/01/20 to complete 2 weeks of therapy including a few extra days after procedure.  I have noted her OPAT labs which are stable. Will arrange for home health to remove her PICC line at end of therapy.  Obstruction of left ureteropelvic junction (UPJ) due to stone Status post lithotripsy with  urology on 08/28/20 for definitive stone management.  Following up with them next week for stent removal.     Raynelle Highland for [facility]ious [facility] 08/29/2020, 3:08 PM

## 2020-08-29 NOTE — Assessment & Plan Note (Signed)
Status post lithotripsy with urology on 08/28/20 for definitive stone management.  Following up with them next week for stent removal.

## 2020-08-29 NOTE — Patient Instructions (Signed)
Thank you for coming to see me today. It was a pleasure seeing you.  To Do: Marland Kitchen Continue IV antibiotcs through 09/01/20 . Home health will arrange to take out your PICC line at that time . Follow up with [place] as needed  If you have any questions or concerns, please do not hesitate to call the office at [phone].  Take Care,   Gwynn Burly, DO

## 2020-08-29 NOTE — Assessment & Plan Note (Signed)
She is doing well in Ertapenem 1gm daily via PICC line for bacteremia 2/2 ESBL E coli from an obstructing stone.  She underwent lithotripsy with urology yesterday for definitive stone management and will continue her antibiotics through 09/01/20 to complete 2 weeks of therapy including a few extra days after procedure.  I have noted her OPAT labs which are stable. Will arrange for home health to remove her PICC line at end of therapy.

## 2020-10-25 ENCOUNTER — Ambulatory Visit: Payer: PRIVATE HEALTH INSURANCE | Attending: Internal Medicine

## 2020-10-25 DIAGNOSIS — Z23 Encounter for immunization: Secondary | ICD-10-CM

## 2020-10-25 NOTE — Progress Notes (Signed)
   Covid-19 Vaccination Clinic  Name:  JESLY HARTMANN    MRN: [id number] DOB: 12/03/1960  10/25/2020  Ms. Kleven was observed post Covid-19 immunization for 15 minutes without incident. She was provided with Vaccine Information Sheet and instruction to access the V-Safe system.   Ms. Strehl was instructed to call 911 with any severe reactions post vaccine: Marland Kitchen Difficulty breathing  . Swelling of face and throat  . A fast heartbeat  . A bad rash all over body  . Dizziness and weakness   Immunizations Administered    Name Date Dose VIS Date Route   Moderna Covid-19 Booster Vaccine 10/25/2020 12:51 PM 0.25 mL 04/19/2020 Intramuscular   Manufacturer: Moderna   Lot: [id number]   NDC: 18299-371-69

## 2020-10-30 ENCOUNTER — Other Ambulatory Visit (HOSPITAL_COMMUNITY): Payer: Self-pay

## 2020-10-30 MED ORDER — COVID-19 MRNA VACC (MODERNA) 100 MCG/0.5ML IM SUSP
INTRAMUSCULAR | 0 refills | Status: DC
  Filled 2020-10-30: qty 0.5, 17d supply, fill #0

## 2020-11-22 ENCOUNTER — Ambulatory Visit: Payer: PRIVATE HEALTH INSURANCE

## 2021-02-08 ENCOUNTER — Emergency Department (HOSPITAL_BASED_OUTPATIENT_CLINIC_OR_DEPARTMENT_OTHER): Payer: No Typology Code available for payment source

## 2021-02-08 ENCOUNTER — Other Ambulatory Visit: Payer: Self-pay

## 2021-02-08 ENCOUNTER — Encounter (HOSPITAL_BASED_OUTPATIENT_CLINIC_OR_DEPARTMENT_OTHER): Payer: Self-pay | Admitting: *Deleted

## 2021-02-08 ENCOUNTER — Emergency Department (HOSPITAL_BASED_OUTPATIENT_CLINIC_OR_DEPARTMENT_OTHER)
Admission: EM | Admit: 2021-02-08 | Discharge: 2021-02-08 | Disposition: A | Discharge status: Home / Self Care | Payer: No Typology Code available for payment source | Attending: Emergency Medicine | Admitting: Emergency Medicine

## 2021-02-08 DIAGNOSIS — R1110 Vomiting, unspecified: Secondary | ICD-10-CM | POA: Diagnosis not present

## 2021-02-08 DIAGNOSIS — R109 Unspecified abdominal pain: Secondary | ICD-10-CM | POA: Insufficient documentation

## 2021-02-08 LAB — CBC WITH DIFFERENTIAL/PLATELET
Abs Immature Granulocytes: 0.04 10*3/uL (ref 0.00–0.07)
Basophils Absolute: 0 10*3/uL (ref 0.0–0.1)
Basophils Relative: 1 %
Eosinophils Absolute: 0.4 10*3/uL (ref 0.0–0.5)
Eosinophils Relative: 4 %
HCT: 47.5 % — ABNORMAL HIGH (ref 36.0–46.0)
Hemoglobin: 15.3 g/dL — ABNORMAL HIGH (ref 12.0–15.0)
Immature Granulocytes: 1 %
Lymphocytes Relative: 30 %
Lymphs Abs: 2.5 10*3/uL (ref 0.7–4.0)
MCH: 28.5 pg (ref 26.0–34.0)
MCHC: 32.2 g/dL (ref 30.0–36.0)
MCV: 88.5 fL (ref 80.0–100.0)
Monocytes Absolute: 0.7 10*3/uL (ref 0.1–1.0)
Monocytes Relative: 9 %
Neutro Abs: 4.7 10*3/uL (ref 1.7–7.7)
Neutrophils Relative %: 55 %
Platelets: 262 10*3/uL (ref 150–400)
RBC: 5.37 MIL/uL — ABNORMAL HIGH (ref 3.87–5.11)
RDW: 14 % (ref 11.5–15.5)
WBC: 8.4 10*3/uL (ref 4.0–10.5)
nRBC: 0 % (ref 0.0–0.2)

## 2021-02-08 LAB — BASIC METABOLIC PANEL
Anion gap: 8 (ref 5–15)
BUN: 24 mg/dL — ABNORMAL HIGH (ref 6–20)
CO2: 28 mmol/L (ref 22–32)
Calcium: 10.7 mg/dL — ABNORMAL HIGH (ref 8.9–10.3)
Chloride: 104 mmol/L (ref 98–111)
Creatinine, Ser: 1 mg/dL (ref 0.44–1.00)
GFR, Estimated: >60 mL/min (ref >60)
Glucose, Bld: 154 mg/dL — ABNORMAL HIGH (ref 70–99)
Potassium: 4.6 mmol/L (ref 3.5–5.1)
Sodium: 140 mmol/L (ref 135–145)

## 2021-02-08 LAB — URINALYSIS, MICROSCOPIC (REFLEX)

## 2021-02-08 LAB — URINALYSIS, ROUTINE W REFLEX MICROSCOPIC
Bilirubin Urine: NEGATIVE
Glucose, UA: NEGATIVE mg/dL
Ketones, ur: NEGATIVE mg/dL
Leukocytes,Ua: NEGATIVE
Nitrite: NEGATIVE
Protein, ur: NEGATIVE mg/dL
Specific Gravity, Urine: >1.03 — ABNORMAL HIGH (ref 1.005–1.030)
pH: 5.5 (ref 5.0–8.0)

## 2021-02-08 LAB — PREGNANCY, URINE: Preg Test, Ur: NEGATIVE

## 2021-02-08 MED ORDER — ONDANSETRON 4 MG PO TBDP: 4.0000 mg | ORAL_TABLET | Freq: Three times a day (TID) | ORAL | 0 refills | Status: DC | PRN

## 2021-02-08 MED ORDER — KETOROLAC TROMETHAMINE 15 MG/ML IJ SOLN
15.0000 mg | Freq: Once | INTRAMUSCULAR | Status: AC
  Administered 2021-02-08: 15 mg via INTRAVENOUS
  Filled 2021-02-08: qty 1

## 2021-02-08 MED ORDER — MORPHINE SULFATE (PF) 4 MG/ML IV SOLN
4.0000 mg | Freq: Once | INTRAVENOUS | Status: AC
  Administered 2021-02-08: 4 mg via INTRAVENOUS
  Filled 2021-02-08: qty 1

## 2021-02-08 MED ORDER — ONDANSETRON HCL 4 MG/2ML IJ SOLN
4.0000 mg | Freq: Once | INTRAMUSCULAR | Status: AC
  Administered 2021-02-08: 4 mg via INTRAVENOUS
  Filled 2021-02-08: qty 2

## 2021-02-08 MED ORDER — SODIUM CHLORIDE 0.9 % IV BOLUS
1000.0000 mL | Freq: Once | INTRAVENOUS | Status: AC
  Administered 2021-02-08: 1000 mL via INTRAVENOUS

## 2021-02-08 MED ORDER — OXYCODONE-ACETAMINOPHEN 5-325 MG PO TABS: 1.0000 | ORAL_TABLET | Freq: Four times a day (QID) | ORAL | 0 refills | Status: DC | PRN

## 2021-02-08 NOTE — ED Triage Notes (Signed)
Right flank pain x 3 hours. Hx of kidney stones.  

## 2021-02-08 NOTE — ED Provider Notes (Signed)
[facility] EMERGENCY DEPARTMENT Provider Note   CSN: [id number] Arrival date & time: 02/08/21  1287     History Chief Complaint  Patient presents with   Flank Pain    Candace Wright is a 60 y.o. female.  HPI     This a 60 year old female with a history of reflux and kidney stones who presents with right flank pain.  Patient reports onset of pain around midnight.  She states it came on all of a sudden.  It is consistent with her prior kidney stones.  She has had multiple episodes of nonbilious, nonbloody emesis.  Reports pain at 10 out of 10.  Has not take anything for her pain at home.  Has not noted any hematuria or dysuria.  No fevers.  Past Medical History:  Diagnosis Date   GERD (gastroesophageal reflux disease)    Left ureteral stone    Wears glasses     Patient Active Problem List   Diagnosis Date Noted   ESBL (extended spectrum beta-lactamase) producing bacteria infection 08/16/2020   E coli bacteremia 08/16/2020   Shortness of breath 08/15/2020   Acute hypoxemic respiratory failure (HCC) 08/15/2020   Sepsis (HCC) 08/15/2020   Obstruction of left ureteropelvic junction (UPJ) due to stone 08/15/2020   AKI (acute kidney injury) (HCC) 08/15/2020    Past Surgical History:  Procedure Laterality Date   CYSTOSCOPY W/ URETERAL STENT PLACEMENT Left 08/15/2020   Procedure: CYSTOSCOPY WITH RETROGRADE PYELOGRAM/URETERAL STENT PLACEMENT;  Surgeon: Crista Elliot, MD;  Location: [facility];  Service: Urology;  Laterality: Left;   CYSTOSCOPY/URETEROSCOPY/HOLMIUM LASER/STENT PLACEMENT Left 08/28/2020   Procedure: CYSTOSCOPY LEFT URETEROSCOPY/HOLMIUM LASER/STENT EXCHANGE;  Surgeon: Crista Elliot, MD;  Location: [facility];  Service: Urology;  Laterality: Left;   LAPAROSCOPIC TUBAL LIGATION  1994   ORIF  LEFT ANKLE FX  2000   PICC LINE INSERTION  right arm   REMOVAL HARDWARE LEFT ANKLE   2001     OB History   No obstetric history on file.      History reviewed. No pertinent family history.  Social History   Tobacco Use   Smoking status: Never   Smokeless tobacco: Never  Vaping Use   Vaping Use: Never used  Substance Use Topics   Alcohol use: No   Drug use: No    Home Medications Prior to Admission medications   Medication Sig Start Date End Date Taking? Authorizing Provider  ondansetron (ZOFRAN ODT) 4 MG disintegrating tablet Take 1 tablet (4 mg total) by mouth every 8 (eight) hours as needed. 02/08/21  Yes Daja Shuping, Mayer Masker, MD  oxyCODONE-acetaminophen (PERCOCET/ROXICET) 5-325 MG tablet Take 1 tablet by mouth every 6 (six) hours as needed for severe pain. 02/08/21  Yes Mimie Goering, Mayer Masker, MD  ciprofloxacin (CIPRO) 500 MG tablet TAKE 1 TABLET BY MOUTH EVERY 12 HOURS FOR 10 DAYS 05/09/20 05/09/21  Maurice Small, MD  COVID-19 mRNA vaccine, Moderna, 100 MCG/0.5ML injection Inject into the muscle. 10/25/20   Judyann Munson, MD  HYDROcodone-acetaminophen (NORCO) 5-325 MG tablet Take 1 tablet by mouth every 6 (six) hours as needed for moderate pain. 01/09/16   Alvira Monday, MD  ondansetron (ZOFRAN) 4 MG tablet Take 1 tablet (4 mg total) by mouth every 6 (six) hours as needed for nausea. 08/18/20   Shahmehdi, Gemma Payor, MD  tamsulosin (FLOMAX) 0.4 MG CAPS capsule Take 1 capsule (0.4 mg total) by mouth daily. 01/09/16   Alvira Monday, MD    Allergies  Ibuprofen  Review of Systems   Review of Systems  Constitutional:  Negative for fever.  Respiratory:  Negative for shortness of breath.   Cardiovascular:  Negative for chest pain.  Gastrointestinal:  Positive for nausea and vomiting. Negative for abdominal pain.  Genitourinary:  Positive for flank pain. Negative for dysuria and hematuria.  All other systems reviewed and are negative.  Physical Exam Updated Vital Signs BP (!) 167/82 (BP Location: Left Arm)   Pulse 71   Temp 98.7 F (37.1 C) (Oral)   Resp 18   Ht 1.549 m (5\' 1" )   Wt 117.9 kg   LMP  (LMP Unknown)    SpO2 96%   BMI 49.13 kg/m   Physical Exam Vitals and nursing note reviewed.  Constitutional:      Appearance: She is well-developed. She is obese. She is not ill-appearing.  HENT:     Head: Normocephalic and atraumatic.     Nose: Nose normal.     Mouth/Throat:     Mouth: Mucous membranes are moist.  Eyes:     Pupils: Pupils are equal, round, and reactive to light.  Cardiovascular:     Rate and Rhythm: Normal rate and regular rhythm.     Heart sounds: Normal heart sounds.  Pulmonary:     Effort: Pulmonary effort is normal. No respiratory distress.     Breath sounds: No wheezing.  Abdominal:     General: Bowel sounds are normal.     Palpations: Abdomen is soft.     Tenderness: There is no right CVA tenderness or left CVA tenderness.  Musculoskeletal:     Cervical back: Neck supple.  Skin:    General: Skin is warm and dry.  Neurological:     Mental Status: She is alert and oriented to person, place, and time.  Psychiatric:        Mood and Affect: Mood normal.    ED Results / Procedures / Treatments   Labs (all labs ordered are listed, but only abnormal results are displayed) Labs Reviewed  CBC WITH DIFFERENTIAL/PLATELET - Abnormal; Notable for the following components:      Result Value   RBC 5.37 (*)    Hemoglobin 15.3 (*)    HCT 47.5 (*)    All other components within normal limits  BASIC METABOLIC PANEL - Abnormal; Notable for the following components:   Glucose, Bld 154 (*)    BUN 24 (*)    Calcium 10.7 (*)    All other components within normal limits  URINALYSIS, ROUTINE W REFLEX MICROSCOPIC - Abnormal; Notable for the following components:   Specific Gravity, Urine >1.030 (*)    Hgb urine dipstick TRACE (*)    All other components within normal limits  URINALYSIS, MICROSCOPIC (REFLEX) - Abnormal; Notable for the following components:   Bacteria, UA RARE (*)    All other components within normal limits  PREGNANCY, URINE    EKG None  Radiology DG  Abdomen 1 View  Result Date: 02/08/2021 CLINICAL DATA:  60 year old female with right flank pain. History of left ureteral stone. EXAM: ABDOMEN - 1 VIEW COMPARISON:  KUB 10/12/2020.  CT Abdomen and Pelvis 08/12/2020. FINDINGS: Supine views of the abdomen and pelvis. Pelvic phleboliths and left flank dystrophic calcification appears stable from April radiographs. No urinary calculus identified today. Non obstructed bowel gas pattern. No acute osseous abnormality identified. Chronic degeneration with vacuum phenomena at the pubic symphysis. IMPRESSION: Nonobstructed bowel-gas pattern with no urinary calculus identified radiographically. Electronically Signed  By: Odessa Fleming M.D.   On: 02/08/2021 05:05    Procedures Procedures   Medications Ordered in ED Medications  sodium chloride 0.9 % bolus 1,000 mL ( Intravenous Stopped 02/08/21 0414)  morphine 4 MG/ML injection 4 mg (4 mg Intravenous Given 02/08/21 0314)  ondansetron (ZOFRAN) injection 4 mg (4 mg Intravenous Given 02/08/21 0314)  ketorolac (TORADOL) 15 MG/ML injection 15 mg (15 mg Intravenous Given 02/08/21 0405)    ED Course  I have reviewed the triage vital signs and the nursing notes.  Pertinent labs & imaging results that were available during my care of the patient were reviewed by me and considered in my medical decision making (see chart for details).    MDM Rules/Calculators/A&P                           Patient presents with flank pain.  History of kidney stones.  She reports that this is similar.  She is nontoxic-appearing.  Vital signs notable for blood pressure 167/82.  Labs obtained.  Patient given fluids, pain, nausea medication.  Urinalysis without evidence of a UTI.  She does have trace hemoglobin in her urine.  No significant metabolic derangements.  KUB of the abdomen does not show any obvious stone.  Discussed with patient that I felt likely that this was kidney stone given her history.  She agrees.  She would like to avoid  any further advanced imaging and treat supportively.  She is able to tolerate fluids and her pain is well controlled.  Recommend follow-up with Dr. Alvester Morin, her urologist.  After history, exam, and medical workup I feel the patient has been appropriately medically screened and is safe for discharge home. Pertinent diagnoses were discussed with the patient. Patient was given return precautions.  Final Clinical Impression(s) / ED Diagnoses Final diagnoses:  Flank pain    Rx / DC Orders ED Discharge Orders          Ordered    oxyCODONE-acetaminophen (PERCOCET/ROXICET) 5-325 MG tablet  Every 6 hours PRN        02/08/21 0611    ondansetron (ZOFRAN ODT) 4 MG disintegrating tablet  Every 8 hours PRN        02/08/21 0611             Shon Baton, MD 02/08/21 [phone]

## 2021-02-08 NOTE — Discharge Instructions (Addendum)
You were seen today for flank pain.  You will be treated presumptively for kidney stones given your history.  Make sure to stay hydrated.  Take nausea and pain medication as needed.  Follow-up with Dr. Alvester Morin if symptoms persist.

## 2021-06-15 ENCOUNTER — Other Ambulatory Visit: Payer: Self-pay | Admitting: Surgery

## 2021-06-15 DIAGNOSIS — E041 Nontoxic single thyroid nodule: Secondary | ICD-10-CM

## 2021-06-18 ENCOUNTER — Other Ambulatory Visit (HOSPITAL_COMMUNITY): Payer: Self-pay | Admitting: Surgery

## 2021-06-18 DIAGNOSIS — E041 Nontoxic single thyroid nodule: Secondary | ICD-10-CM

## 2021-06-28 ENCOUNTER — Other Ambulatory Visit: Payer: Self-pay

## 2021-06-28 ENCOUNTER — Ambulatory Visit (HOSPITAL_COMMUNITY)
Admission: RE | Admit: 2021-06-28 | Discharge: 2021-06-28 | Disposition: A | Discharge status: Home / Self Care | Payer: No Typology Code available for payment source | Source: Ambulatory Visit | Attending: Surgery | Admitting: Surgery

## 2021-06-28 ENCOUNTER — Encounter (HOSPITAL_COMMUNITY)
Admission: RE | Admit: 2021-06-28 | Discharge: 2021-06-28 | Disposition: A | Discharge status: Home / Self Care | Payer: No Typology Code available for payment source | Source: Ambulatory Visit | Attending: Surgery | Admitting: Surgery

## 2021-06-28 DIAGNOSIS — E041 Nontoxic single thyroid nodule: Secondary | ICD-10-CM | POA: Diagnosis present

## 2021-06-28 MED ORDER — TECHNETIUM TC 99M SESTAMIBI GENERIC - CARDIOLITE: 25.2000 | Freq: Once | INTRAVENOUS | Status: DC | PRN

## 2021-07-06 ENCOUNTER — Ambulatory Visit: Payer: Self-pay | Admitting: Surgery

## 2021-07-06 NOTE — Progress Notes (Signed)
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  Plan minimally invasive out-patient surgery in the near future.  Discussed by telephone with the patient this afternoon.  Thyroid nodule on right is a benign, spongiform nodule and does not require biopsy.  Will submit orders for surgery and have my schedulers contact the patient.  tmg  Darnell Level, MD [facility] practice Office: [phone]

## 2021-07-24 NOTE — Patient Instructions (Signed)
DUE TO COVID-19 ONLY ONE VISITOR IS ALLOWED TO COME WITH YOU AND STAY IN THE WAITING ROOM ONLY DURING PRE OP AND PROCEDURE.   **NO VISITORS ARE ALLOWED IN THE SHORT STAY AREA OR RECOVERY ROOM!!**  IF YOU WILL BE ADMITTED INTO THE HOSPITAL YOU ARE ALLOWED ONLY TWO SUPPORT PEOPLE DURING VISITATION HOURS ONLY (7 AM -8PM)   The support person(s) must pass our screening, gel in and out, and wear a mask at all times, including in the patients room. Patients must also wear a mask when staff or their support person are in the room. Visitors GUEST BADGE MUST BE WORN VISIBLY  One adult visitor may remain with you overnight and MUST be in the room by 8 P.M.  No visitors under the age of 47. Any visitor under the age of 71 must be accompanied by an adult.     Your procedure is scheduled on: 08/03/21   Report to [facility] Main Entrance    Report to admitting at: 8:45 AM   Call this number if you have problems the morning of surgery [phone]   Do not eat food :After Midnight.   May have liquids until : 8:00 AM   day of surgery  CLEAR LIQUID DIET  Foods Allowed                                                                     Foods Excluded  Water, Black Coffee and tea, regular and decaf                             liquids that you cannot  Plain Jell-O in any flavor  (No red)                                           see through such as: Fruit ices (not with fruit pulp)                                     milk, soups, orange juice              Iced Popsicles (No red)                                    All solid food                                   Apple juices Sports drinks like Gatorade (No red) Lightly seasoned clear broth or consume(fat free) Sugar  Sample Menu Breakfast                                Lunch  Supper Cranberry juice                    Beef broth                            Chicken broth Jell-O                                      Grape juice                           Apple juice Coffee or tea                        Jell-O                                      Popsicle                                                Coffee or tea                        Coffee or tea    Oral Hygiene is also important to reduce your risk of infection.                                    Remember - BRUSH YOUR TEETH THE MORNING OF SURGERY WITH YOUR REGULAR TOOTHPASTE   Do NOT smoke after Midnight   Take these medicines the morning of surgery with A SIP OF WATER: loratadine as needed.  DO NOT TAKE ANY ORAL DIABETIC MEDICATIONS DAY OF YOUR SURGERY                              You may not have any metal on your body including hair pins, jewelry, and body piercing             Do not wear make-up, lotions, powders, perfumes/cologne, or deodorant  Do not wear nail polish including gel and S&S, artificial/acrylic nails, or any other type of covering on natural nails including finger and toenails. If you have artificial nails, gel coating, etc. that needs to be removed by a nail salon please have this removed prior to surgery or surgery may need to be canceled/ delayed if the surgeon/ anesthesia feels like they are unable to be safely monitored.   Do not shave  48 hours prior to surgery.   Do not bring valuables to the hospital. [facility] IS NOT             RESPONSIBLE   FOR VALUABLES.   Contacts, dentures or bridgework may not be worn into surgery.   Bring small overnight bag day of surgery.    Patients discharged on the day of surgery will not be allowed to drive home.  Someone needs to stay with you for the first 24 hours after anesthesia.   Special Instructions: Bring a copy of your healthcare power  of attorney and living will documents         the day of surgery if you haven't scanned them before.              Please read over the following fact sheets you were given: IF YOU HAVE QUESTIONS ABOUT YOUR PRE-OP INSTRUCTIONS PLEASE  CALL [phone]     [facility] - Preparing for Surgery Before surgery, you can play an important role.  Because skin is not sterile, your skin needs to be as free of germs as possible.  You can reduce the number of germs on your skin by washing with CHG (chlorahexidine gluconate) soap before surgery.  CHG is an antiseptic cleaner which kills germs and bonds with the skin to continue killing germs even after washing. Please DO NOT use if you have an allergy to CHG or antibacterial soaps.  If your skin becomes reddened/irritated stop using the CHG and inform your nurse when you arrive at Short Stay. Do not shave (including legs and underarms) for at least 48 hours prior to the first CHG shower.  You may shave your face/neck. Please follow these instructions carefully:  1.  Shower with CHG Soap the night before surgery and the  morning of Surgery.  2.  If you choose to wash your hair, wash your hair first as usual with your  normal  shampoo.  3.  After you shampoo, rinse your hair and body thoroughly to remove the  shampoo.                           4.  Use CHG as you would any other liquid soap.  You can apply chg directly  to the skin and wash                       Gently with a scrungie or clean washcloth.  5.  Apply the CHG Soap to your body ONLY FROM THE NECK DOWN.   Do not use on face/ open                           Wound or open sores. Avoid contact with eyes, ears mouth and genitals (private parts).                       Wash face,  Genitals (private parts) with your normal soap.             6.  Wash thoroughly, paying special attention to the area where your surgery  will be performed.  7.  Thoroughly rinse your body with warm water from the neck down.  8.  DO NOT shower/wash with your normal soap after using and rinsing off  the CHG Soap.                9.  Pat yourself dry with a clean towel.            10.  Wear clean pajamas.            11.  Place clean sheets on your bed the night of  your first shower and do not  sleep with pets. Day of Surgery : Do not apply any lotions/deodorants the morning of surgery.  Please wear clean clothes to the hospital/surgery center.  FAILURE TO FOLLOW THESE INSTRUCTIONS MAY RESULT IN THE CANCELLATION OF YOUR SURGERY PATIENT SIGNATURE_________________________________  NURSE SIGNATURE__________________________________  ________________________________________________________________________

## 2021-07-26 ENCOUNTER — Encounter (HOSPITAL_COMMUNITY): Payer: Self-pay

## 2021-07-26 ENCOUNTER — Encounter (HOSPITAL_COMMUNITY)
Admission: RE | Admit: 2021-07-26 | Discharge: 2021-07-26 | Disposition: A | Discharge status: Home / Self Care | Payer: No Typology Code available for payment source | Source: Ambulatory Visit | Attending: Surgery | Admitting: Surgery

## 2021-07-26 ENCOUNTER — Other Ambulatory Visit: Payer: Self-pay

## 2021-07-26 VITALS — BP 150/78 | HR 61 | Temp 98.2°F | Ht 61.0 in | Wt 255.0 lb

## 2021-07-26 DIAGNOSIS — Z01818 Encounter for other preprocedural examination: Secondary | ICD-10-CM

## 2021-07-26 DIAGNOSIS — Z01812 Encounter for preprocedural laboratory examination: Secondary | ICD-10-CM | POA: Diagnosis present

## 2021-07-26 HISTORY — DX: Personal history of urinary calculi: Z87.442

## 2021-07-26 LAB — CBC
HCT: 48.7 % — ABNORMAL HIGH (ref 36.0–46.0)
Hemoglobin: 15.6 g/dL — ABNORMAL HIGH (ref 12.0–15.0)
MCH: 28.9 pg (ref 26.0–34.0)
MCHC: 32 g/dL (ref 30.0–36.0)
MCV: 90.2 fL (ref 80.0–100.0)
Platelets: 280 10*3/uL (ref 150–400)
RBC: 5.4 MIL/uL — ABNORMAL HIGH (ref 3.87–5.11)
RDW: 13.5 % (ref 11.5–15.5)
WBC: 6.8 10*3/uL (ref 4.0–10.5)
nRBC: 0 % (ref 0.0–0.2)

## 2021-07-26 NOTE — Progress Notes (Signed)
COVID Vaccine Completed: Yes Date COVID Vaccine completed: 10/25/20 x 3 COVID vaccine manufacturer:    Moderna    COVID Test: N/A PCP - Dr. Maurice Small Cardiologist -   Chest x-ray -  EKG - 08/18/20 Stress Test -  ECHO -  Cardiac Cath -  Pacemaker/ICD device last checked:  Sleep Study -  CPAP -   Fasting Blood Sugar -  Checks Blood Sugar _____ times a day  Blood Thinner Instructions: Aspirin Instructions: Last Dose:  Anesthesia review:   Patient denies shortness of breath, fever, cough and chest pain at PAT appointment   Patient verbalized understanding of instructions that were given to them at the PAT appointment. Patient was also instructed that they will need to review over the PAT instructions again at home before surgery.

## 2021-07-27 ENCOUNTER — Encounter (HOSPITAL_COMMUNITY): Payer: Self-pay | Admitting: Surgery

## 2021-07-27 DIAGNOSIS — E210 Primary hyperparathyroidism: Secondary | ICD-10-CM | POA: Diagnosis present

## 2021-07-27 NOTE — H&P (Signed)
REFERRING PHYSICIAN: Sharion Balloon I*  PROVIDER: Latavion Halls Myra Rude, MD  Chief Complaint: New Consultation (hypercalcemia)   History of Present Illness:  Patient is referred by Dr. Modena Slater for surgical evaluation and management of hypercalcemia and suspected primary hyperparathyroidism. Patient's primary care physician is Dr. Maurice Small. Patient has had multiple kidney stones over the past 3 to 4 years. Stone analysis demonstrated evidence of calcium oxalate. Laboratory studies show elevated serum calcium levels as high as 10.7. Patient also notes significant fatigue. She has had no recent fractures. She does complain of right hip and lower back pain. Patient has had no prior head or neck surgery. There is no family history of parathyroid disease or other endocrine neoplasm. Patient is accompanied today by her daughter. The patient works at the [employer] at [employer].  Review of Systems: A complete review of systems was obtained from the patient. I have reviewed this information and discussed as appropriate with the patient. See HPI as well for other ROS.  Review of Systems  Constitutional: Positive for malaise/fatigue.  HENT: Negative.  Eyes: Negative.  Respiratory: Negative.  Cardiovascular: Negative.  Gastrointestinal: Negative.  Genitourinary: Negative.  Kidney stones  Musculoskeletal: Positive for back pain and joint pain.  Skin: Negative.  Neurological: Negative. Negative for tremors.  Endo/Heme/Allergies: Negative.  Psychiatric/Behavioral: Negative.    Medical History: History reviewed. No pertinent past medical history.  Patient Active Problem List  Diagnosis   Hypercalcemia   Thyroid nodule   Past Surgical History:  Procedure Laterality Date   ARTHROSCOPY ANKLE FOR REPAIR FRACTURE 1998   ESSURE TUBAL LIGATION   LITHOTRIPSY 08/2020    Allergies  Allergen Reactions   Ibuprofen Other (See Comments)  "Funny feeling in my  head"   Current Outpatient Medications on File Prior to Visit  Medication Sig Dispense Refill   multivitamin-min-iron-FA-vit K (MULTI FOR HER) 18 mg iron-600 mcg-40 mcg Cap 1 tablet   No current facility-administered medications on file prior to visit.   Family History  Problem Relation Age of Onset   Colon cancer Mother    Social History   Tobacco Use  Smoking Status Never  Smokeless Tobacco Never    Social History   Socioeconomic History   Marital status: Married  Tobacco Use   Smoking status: Never   Smokeless tobacco: Never  Substance and Sexual Activity   Alcohol use: Never   Drug use: Never   Objective:   Vitals:  BP: (!) 148/92  Pulse: 107  Temp: 37.1 C (98.7 F)  SpO2: 96%  Weight: (!) 116.8 kg (257 lb 6.4 oz)  Height: 154.9 cm (5\' 1" )   Body mass index is 48.64 kg/m.  Physical Exam   GENERAL APPEARANCE Development: normal Nutritional status: normal Gross deformities: none  SKIN Rash, lesions, ulcers: none Induration, erythema: none Nodules: none palpable  EYES Conjunctiva and lids: normal Pupils: equal and reactive Iris: normal bilaterally  EARS, NOSE, MOUTH, THROAT External ears: no lesion or deformity External nose: no lesion or deformity Hearing: grossly normal Due to Covid-19 pandemic, patient is wearing a mask.  NECK Symmetric: yes Trachea: midline Thyroid: There is a palpable approximately 2 cm smooth nodule in the upper portion of the right thyroid lobe. This is nontender. No palpable nodularity in the left thyroid lobe. No palpable lymphadenopathy.  CHEST Respiratory effort: normal Retraction or accessory muscle use: no Breath sounds: normal bilaterally Rales, rhonchi, wheeze: none  CARDIOVASCULAR Auscultation: regular rhythm, normal rate Murmurs: none Pulses:  radial pulse 2+ palpable Lower extremity edema: none  MUSCULOSKELETAL Station and gait: normal Digits and nails: no clubbing or cyanosis Muscle strength:  grossly normal all extremities Range of motion: grossly normal all extremities Deformity: none  LYMPHATIC Cervical: none palpable Supraclavicular: none palpable  PSYCHIATRIC Oriented to person, place, and time: yes Mood and affect: normal for situation Judgment and insight: appropriate for situation  Assessment and Plan:   Hypercalcemia  Thyroid nodule   Patient is referred by her urologist, Dr. Modena Slater, for surgical evaluation of hypercalcemia and suspected primary hyperparathyroidism.  Patient provided with a copy of "Parathyroid Surgery: Treatment for Your Parathyroid Gland Problem", published by [employer], 12 pages. Book reviewed and explained to patient during visit today.  Patient has biochemical evidence of primary hyperparathyroidism. We will repeat her laboratory studies at [facility] in order to confirm the values. We will also get a 25-hydroxy vitamin D level. Patient will also undergo an ultrasound of the thyroid and surrounding tissues as she has a palpable nodule in the right anterior neck suspicious for a thyroid nodule. We will also obtain a nuclear medicine parathyroid scan. Once the studies are completed, I will communicate with the patient the results and make a decision regarding further management at that time.  Today we discussed parathyroid surgery briefly. We discussed doing this procedure as an outpatient procedure if necessary. I provided them with written literature on parathyroid surgery to review at home.  Patient will undergo the above testing. We will contact her with the results when they are available.  Darnell Level, MD [facility] practice Office: [phone]

## 2021-08-03 ENCOUNTER — Ambulatory Visit (HOSPITAL_COMMUNITY)
Admission: RE | Admit: 2021-08-03 | Discharge: 2021-08-03 | Disposition: A | Discharge status: Home / Self Care | Payer: No Typology Code available for payment source | Source: Ambulatory Visit | Attending: Surgery | Admitting: Surgery

## 2021-08-03 ENCOUNTER — Ambulatory Visit (HOSPITAL_COMMUNITY): Payer: No Typology Code available for payment source | Admitting: Certified Registered Nurse Anesthetist

## 2021-08-03 ENCOUNTER — Encounter (HOSPITAL_COMMUNITY): Payer: Self-pay | Admitting: Surgery

## 2021-08-03 ENCOUNTER — Encounter (HOSPITAL_COMMUNITY)
Admission: RE | Disposition: A | Discharge status: Home / Self Care | Payer: Self-pay | Source: Ambulatory Visit | Attending: Surgery

## 2021-08-03 DIAGNOSIS — K219 Gastro-esophageal reflux disease without esophagitis: Secondary | ICD-10-CM | POA: Insufficient documentation

## 2021-08-03 DIAGNOSIS — M25551 Pain in right hip: Secondary | ICD-10-CM | POA: Insufficient documentation

## 2021-08-03 DIAGNOSIS — M545 Low back pain, unspecified: Secondary | ICD-10-CM | POA: Diagnosis not present

## 2021-08-03 DIAGNOSIS — E210 Primary hyperparathyroidism: Secondary | ICD-10-CM | POA: Insufficient documentation

## 2021-08-03 DIAGNOSIS — R5383 Other fatigue: Secondary | ICD-10-CM | POA: Diagnosis not present

## 2021-08-03 DIAGNOSIS — Z6841 Body Mass Index (BMI) 40.0 and over, adult: Secondary | ICD-10-CM | POA: Diagnosis not present

## 2021-08-03 DIAGNOSIS — Z87442 Personal history of urinary calculi: Secondary | ICD-10-CM | POA: Insufficient documentation

## 2021-08-03 HISTORY — PX: PARATHYROIDECTOMY: SHX19

## 2021-08-03 SURGERY — PARATHYROIDECTOMY
Anesthesia: General | Laterality: Left

## 2021-08-03 MED ORDER — ROCURONIUM BROMIDE 10 MG/ML (PF) SYRINGE
PREFILLED_SYRINGE | INTRAVENOUS | Status: AC
  Filled 2021-08-03: qty 10

## 2021-08-03 MED ORDER — FENTANYL CITRATE (PF) 100 MCG/2ML IJ SOLN
INTRAMUSCULAR | Status: DC | PRN
  Administered 2021-08-03: 50 ug via INTRAVENOUS
  Administered 2021-08-03: 100 ug via INTRAVENOUS
  Administered 2021-08-03: 50 ug via INTRAVENOUS

## 2021-08-03 MED ORDER — FENTANYL CITRATE (PF) 100 MCG/2ML IJ SOLN
INTRAMUSCULAR | Status: AC
  Filled 2021-08-03: qty 2

## 2021-08-03 MED ORDER — LACTATED RINGERS IV SOLN: INTRAVENOUS | Status: DC

## 2021-08-03 MED ORDER — PROPOFOL 10 MG/ML IV BOLUS
INTRAVENOUS | Status: DC | PRN
  Administered 2021-08-03: 200 mg via INTRAVENOUS

## 2021-08-03 MED ORDER — BUPIVACAINE HCL 0.25 % IJ SOLN
INTRAMUSCULAR | Status: DC | PRN
  Administered 2021-08-03: 10 mL

## 2021-08-03 MED ORDER — ONDANSETRON HCL 4 MG/2ML IJ SOLN
INTRAMUSCULAR | Status: AC
  Filled 2021-08-03: qty 2

## 2021-08-03 MED ORDER — PROMETHAZINE HCL 25 MG/ML IJ SOLN: 6.2500 mg | INTRAMUSCULAR | Status: DC | PRN

## 2021-08-03 MED ORDER — CEFAZOLIN SODIUM-DEXTROSE 2-4 GM/100ML-% IV SOLN
2.0000 g | INTRAVENOUS | Status: AC
  Administered 2021-08-03: 2 g via INTRAVENOUS
  Filled 2021-08-03: qty 100

## 2021-08-03 MED ORDER — BUPIVACAINE HCL (PF) 0.25 % IJ SOLN
INTRAMUSCULAR | Status: AC
  Filled 2021-08-03: qty 30

## 2021-08-03 MED ORDER — MIDAZOLAM HCL 5 MG/5ML IJ SOLN
INTRAMUSCULAR | Status: DC | PRN
  Administered 2021-08-03: 2 mg via INTRAVENOUS

## 2021-08-03 MED ORDER — LIDOCAINE 2% (20 MG/ML) 5 ML SYRINGE
INTRAMUSCULAR | Status: DC | PRN
  Administered 2021-08-03: 100 mg via INTRAVENOUS

## 2021-08-03 MED ORDER — ONDANSETRON HCL 4 MG/2ML IJ SOLN
INTRAMUSCULAR | Status: DC | PRN
Start: 2021-08-03 — End: 2021-08-03
  Administered 2021-08-03: 4 mg via INTRAVENOUS

## 2021-08-03 MED ORDER — MIDAZOLAM HCL 2 MG/2ML IJ SOLN
INTRAMUSCULAR | Status: AC
  Filled 2021-08-03: qty 2

## 2021-08-03 MED ORDER — ROCURONIUM BROMIDE 10 MG/ML (PF) SYRINGE
PREFILLED_SYRINGE | INTRAVENOUS | Status: DC | PRN
  Administered 2021-08-03: 60 mg via INTRAVENOUS

## 2021-08-03 MED ORDER — DEXAMETHASONE SODIUM PHOSPHATE 4 MG/ML IJ SOLN
INTRAMUSCULAR | Status: DC | PRN
  Administered 2021-08-03: 8 mg via INTRAVENOUS

## 2021-08-03 MED ORDER — SCOPOLAMINE 1 MG/3DAYS TD PT72
1.0000 | MEDICATED_PATCH | TRANSDERMAL | Status: DC
  Administered 2021-08-03: 1.5 mg via TRANSDERMAL
  Filled 2021-08-03: qty 1

## 2021-08-03 MED ORDER — ORAL CARE MOUTH RINSE: 15.0000 mL | Freq: Once | OROMUCOSAL | Status: AC

## 2021-08-03 MED ORDER — PROPOFOL 10 MG/ML IV BOLUS
INTRAVENOUS | Status: AC
  Filled 2021-08-03: qty 20

## 2021-08-03 MED ORDER — HEMOSTATIC AGENTS (NO CHARGE) OPTIME
TOPICAL | Status: DC | PRN
  Administered 2021-08-03: 1 via TOPICAL

## 2021-08-03 MED ORDER — HYDROMORPHONE HCL 1 MG/ML IJ SOLN
0.2500 mg | INTRAMUSCULAR | Status: DC | PRN
  Administered 2021-08-03: 0.5 mg via INTRAVENOUS

## 2021-08-03 MED ORDER — SUGAMMADEX SODIUM 200 MG/2ML IV SOLN
INTRAVENOUS | Status: DC | PRN
  Administered 2021-08-03: 300 mg via INTRAVENOUS

## 2021-08-03 MED ORDER — ACETAMINOPHEN 500 MG PO TABS
1000.0000 mg | ORAL_TABLET | Freq: Once | ORAL | Status: AC
  Administered 2021-08-03: 1000 mg via ORAL
  Filled 2021-08-03: qty 2

## 2021-08-03 MED ORDER — CHLORHEXIDINE GLUCONATE CLOTH 2 % EX PADS: 6.0000 | MEDICATED_PAD | Freq: Once | CUTANEOUS | Status: DC

## 2021-08-03 MED ORDER — DEXAMETHASONE SODIUM PHOSPHATE 10 MG/ML IJ SOLN
INTRAMUSCULAR | Status: AC
  Filled 2021-08-03: qty 1

## 2021-08-03 MED ORDER — HYDROMORPHONE HCL 1 MG/ML IJ SOLN
INTRAMUSCULAR | Status: AC
  Filled 2021-08-03: qty 1

## 2021-08-03 MED ORDER — LIDOCAINE HCL (PF) 2 % IJ SOLN
INTRAMUSCULAR | Status: AC
  Filled 2021-08-03: qty 5

## 2021-08-03 MED ORDER — 0.9 % SODIUM CHLORIDE (POUR BTL) OPTIME
TOPICAL | Status: DC | PRN
  Administered 2021-08-03: 1000 mL

## 2021-08-03 MED ORDER — TRAMADOL HCL 50 MG PO TABS: 50.0000 mg | ORAL_TABLET | Freq: Four times a day (QID) | ORAL | 0 refills | Status: AC | PRN

## 2021-08-03 MED ORDER — MEPERIDINE HCL 50 MG/ML IJ SOLN: 6.2500 mg | INTRAMUSCULAR | Status: DC | PRN

## 2021-08-03 MED ORDER — CHLORHEXIDINE GLUCONATE 0.12 % MT SOLN
15.0000 mL | Freq: Once | OROMUCOSAL | Status: AC
  Administered 2021-08-03: 15 mL via OROMUCOSAL

## 2021-08-03 SURGICAL SUPPLY — 33 items
ATTRACTOMAT 16X20 MAGNETIC DRP (DRAPES) ×2 IMPLANT
BAG COUNTER SPONGE SURGICOUNT (BAG) ×2 IMPLANT
BLADE SURG 15 STRL LF DISP TIS (BLADE) ×1 IMPLANT
BLADE SURG 15 STRL SS (BLADE) ×1
CHLORAPREP W/TINT 26 (MISCELLANEOUS) ×2 IMPLANT
CLIP TI MEDIUM 6 (CLIP) ×4 IMPLANT
CLIP TI WIDE RED SMALL 6 (CLIP) ×4 IMPLANT
COVER SURGICAL LIGHT HANDLE (MISCELLANEOUS) ×2 IMPLANT
DERMABOND ADVANCED (GAUZE/BANDAGES/DRESSINGS) ×1
DERMABOND ADVANCED .7 DNX12 (GAUZE/BANDAGES/DRESSINGS) ×1 IMPLANT
DRAPE LAPAROTOMY T 98X78 PEDS (DRAPES) ×2 IMPLANT
DRAPE UTILITY XL STRL (DRAPES) ×2 IMPLANT
ELECT REM PT RETURN 15FT ADLT (MISCELLANEOUS) ×2 IMPLANT
GAUZE 4X4 16PLY ~~LOC~~+RFID DBL (SPONGE) ×2 IMPLANT
GLOVE SURG SYN 7.5  E (GLOVE) ×2
GLOVE SURG SYN 7.5 E (GLOVE) ×2 IMPLANT
GLOVE SURG SYN 7.5 PF PI (GLOVE) ×2 IMPLANT
GOWN STRL REUS W/TWL XL LVL3 (GOWN DISPOSABLE) ×6 IMPLANT
HEMOSTAT SURGICEL 2X4 FIBR (HEMOSTASIS) ×2 IMPLANT
ILLUMINATOR WAVEGUIDE N/F (MISCELLANEOUS) IMPLANT
KIT BASIN OR (CUSTOM PROCEDURE TRAY) ×2 IMPLANT
KIT TURNOVER KIT A (KITS) ×1 IMPLANT
NDL HYPO 25X1 1.5 SAFETY (NEEDLE) ×1 IMPLANT
NEEDLE HYPO 25X1 1.5 SAFETY (NEEDLE) ×2 IMPLANT
PACK BASIC VI WITH GOWN DISP (CUSTOM PROCEDURE TRAY) ×2 IMPLANT
PENCIL SMOKE EVACUATOR (MISCELLANEOUS) ×2 IMPLANT
SUT MNCRL AB 4-0 PS2 18 (SUTURE) ×2 IMPLANT
SUT VIC AB 3-0 SH 18 (SUTURE) ×2 IMPLANT
SYR BULB IRRIG 60ML STRL (SYRINGE) ×2 IMPLANT
SYR CONTROL 10ML LL (SYRINGE) ×2 IMPLANT
TOWEL OR 17X26 10 PK STRL BLUE (TOWEL DISPOSABLE) ×2 IMPLANT
TOWEL OR NON WOVEN STRL DISP B (DISPOSABLE) ×2 IMPLANT
TUBING CONNECTING 10 (TUBING) ×2 IMPLANT

## 2021-08-03 NOTE — Interval H&P Note (Signed)
History and Physical Interval Note:  08/03/2021 10:41 AM  Candace Wright  has presented today for surgery, with the diagnosis of PRIMARY HYPERPARATHYROIDISM.  The various methods of treatment have been discussed with the patient and family. After consideration of risks, benefits and other options for treatment, the patient has consented to    Procedure(s): LEFT INFERIOR PARATHYROIDECTOMY (Left) as a surgical intervention.    The patient's history has been reviewed, patient examined, no change in status, stable for surgery.  I have reviewed the patient's chart and labs.  Questions were answered to the patient's satisfaction.    Darnell Level, MD [facility] practice Office: [phone]   Darnell Level

## 2021-08-03 NOTE — Discharge Instructions (Addendum)
[facility] - Dr. Todd Gerkin  THYROID & PARATHYROID SURGERY:  POST-OP INSTRUCTIONS  Always review the instruction sheet provided by the hospital nurse at discharge.  A prescription for pain medication may be sent to your pharmacy at the time of discharge.  Take your pain medication as prescribed.  If narcotic pain medicine is not needed, then you may take acetaminophen (Tylenol) or ibuprofen (Advil) as needed for pain or soreness.  Take your normal home medications as prescribed unless otherwise directed.  If you need a refill on your pain medication, please contact the office during regular business hours.  Prescriptions will not be processed by the office after 5:00PM or on weekends.  Start with a light diet upon arrival home, such as soup and crackers or toast.  Be sure to drink plenty of fluids.  Resume your normal diet the day after surgery.  Most patients will experience some swelling and bruising on the chest and neck area.  Ice packs will help for the first 48 hours after arriving home.  Swelling and bruising will take several days to resolve.   It is common to experience some constipation after surgery.  Increasing fluid intake and taking a stool softener (Colace) will usually help to prevent this problem.  A mild laxative (Milk of Magnesia or Miralax) should be taken according to package directions if there has been no bowel movement after 48 hours.  Dermabond glue covers your incision. This seals the wound and you may shower at any time. The Dermabond will remain in place for about a week.  You may gradually remove the glue when it loosens around the edges.  If you need to loosen the Dermabond for removal, apply a layer of Vaseline to the wound for 15 minutes and then remove with a Kleenex. Your sutures are under the skin and will not show - they will dissolve on their own.  You may resume light daily activities beginning the day after discharge (such as self-care,  walking, climbing stairs), gradually increasing activities as tolerated. You may have sexual intercourse when it is comfortable. Refrain from any heavy lifting or straining until approved by your doctor. You may drive when you no longer are taking prescription pain medication, you can comfortably wear a seatbelt, and you can safely maneuver your car and apply the brakes.  You will see your doctor in the office for a follow-up appointment approximately three weeks after your surgery.  Make sure that you call for this appointment within a day or two after you arrive home to insure a convenient appointment time. Please have any requested laboratory tests performed a few days prior to your office visit so that the results will be available at your follow up appointment.  WHEN TO CALL THE [facility] OFFICE: -- Fever greater than 101.5 -- Inability to urinate -- Nausea and/or vomiting - persistent -- Extreme swelling or bruising -- Continued bleeding from incision -- Increased pain, redness, or drainage from the incision -- Difficulty swallowing or breathing -- Muscle cramping or spasms -- Numbness or tingling in hands or around lips  The clinic staff is available to answer your questions during regular business hours.  Please don't hesitate to call and ask to speak to one of the nurses if you have concerns.  [facility] OFFICE: [phone] (24 hours)  Please sign up for MyChart accounts. This will allow you to communicate directly with my nurse or myself without having to call the office. It will also allow you   to view your test results. You will need to enroll in MyChart for my office ([facility]) and for the hospital ([facility]).  Todd Gerkin, MD [facility] A [facility] practice 

## 2021-08-03 NOTE — Anesthesia Preprocedure Evaluation (Addendum)
Anesthesia Evaluation  Patient identified by MRN, date of birth, ID band Patient awake    Reviewed: Allergy & Precautions, H&P , NPO status , Patient's Chart, lab work & pertinent test results  Airway Mallampati: II  TM Distance: >3 FB Neck ROM: Full    Dental  (+) Dental Advisory Given, Teeth Intact   Pulmonary shortness of breath,    Pulmonary exam normal breath sounds clear to auscultation       Cardiovascular negative cardio ROS Normal cardiovascular exam Rhythm:Regular Rate:Normal     Neuro/Psych negative neurological ROS  negative psych ROS   GI/Hepatic Neg liver ROS, GERD  ,  Endo/Other  Morbid obesity  Renal/GU Renal disease (kidney stones)     Musculoskeletal negative musculoskeletal ROS (+)   Abdominal (+) + obese,   Peds  Hematology negative hematology ROS (+)   Anesthesia Other Findings   Reproductive/Obstetrics negative OB ROS                            Anesthesia Physical  Anesthesia Plan  ASA: 3  Anesthesia Plan: General   Post-op Pain Management: Tylenol PO (pre-op) and Minimal or no pain anticipated   Induction: Intravenous  PONV Risk Score and Plan: 4 or greater and Ondansetron, Dexamethasone, Treatment may vary due to age or medical condition, Midazolam and Scopolamine patch - Pre-op  Airway Management Planned: Oral ETT  Additional Equipment: None  Intra-op Plan:   Post-operative Plan: Extubation in OR  Informed Consent: I have reviewed the patients History and Physical, chart, labs and discussed the procedure including the risks, benefits and alternatives for the proposed anesthesia with the patient or authorized representative who has indicated his/her understanding and acceptance.     Dental advisory given  Plan Discussed with: CRNA  Anesthesia Plan Comments:       Anesthesia Quick Evaluation

## 2021-08-03 NOTE — Op Note (Signed)
OPERATIVE REPORT - PARATHYROIDECTOMY  Preoperative diagnosis: Primary hyperparathyroidism  Postop diagnosis: Same  Procedure: Left inferior minimally invasive parathyroidectomy  Surgeon:  Armandina Gemma, MD  Anesthesia: General endotracheal  Estimated blood loss: Minimal  Preparation: ChloraPrep  Indications: Patient is referred by Dr. Link Snuffer for surgical evaluation and management of hypercalcemia and suspected primary hyperparathyroidism. Patient's primary care physician is Dr. Kelton Pillar. Patient has had multiple kidney stones over the past 3 to 4 years. Stone analysis demonstrated evidence of calcium oxalate. Laboratory studies show elevated serum calcium levels as high as 10.7. Patient also notes significant fatigue. She has had no recent fractures. She does complain of right hip and lower back pain. Patient has had no prior head or neck surgery. There is no family history of parathyroid disease or other endocrine neoplasm. Patient is accompanied today by her daughter. The patient works at the [employer], [employer] at [employer].  Procedure: The patient was prepared in the pre-operative holding area. The patient was brought to the operating room and placed in a supine position on the operating room table. Following administration of general anesthesia, the patient was positioned and then prepped and draped in the usual strict aseptic fashion. After ascertaining that an adequate level of anesthesia been achieved, a neck incision was made with a #15 blade. Dissection was carried through subcutaneous tissues and platysma. Hemostasis was obtained with the electrocautery. Skin flaps were developed circumferentially and a Weitlander retractor was placed for exposure.  Strap muscles were incised in the midline. Strap muscles were reflected laterally exposing the thyroid lobe. With gentle blunt dissection the thyroid lobe was mobilized.  Dissection was carried posteriorly and inferiorly and an  enlarged parathyroid gland was identified. It was gently mobilized. Vascular structures were divided between small ligaclips. Care was taken to avoid the recurrent laryngeal nerve. The parathyroid gland was completely excised. It was submitted to pathology where frozen section by Dr. Claudette Laws confirmed hypercellular parathyroid tissue consistent with adenoma.  There was a nodular mass just beneath the parathyroid at the inferior pole of the thyroid.  This was excised using the electrocautery and submitted to pathology for permanent review.  Neck was irrigated with warm saline and good hemostasis was noted. Fibrillar was placed in the operative field. Strap muscles were approximated in the midline with interrupted 3-0 Vicryl sutures. Platysma was closed with interrupted 3-0 Vicryl sutures. Marcaine was infiltrated circumferentially. Skin was closed with a running 4-0 Monocryl subcuticular suture. Wound was washed and dried and Dermabond was applied. Patient was awakened from anesthesia and brought to the recovery room. The patient tolerated the procedure well.   Armandina Gemma, MD [facility] Office: [phone]

## 2021-08-03 NOTE — Anesthesia Postprocedure Evaluation (Signed)
Anesthesia Post Note  Patient: Candace Wright  Procedure(s) Performed: LEFT INFERIOR PARATHYROIDECTOMY (Left)     Patient location during evaluation: PACU Anesthesia Type: General Level of consciousness: sedated and patient cooperative Pain management: pain level controlled Vital Signs Assessment: post-procedure vital signs reviewed and stable Respiratory status: spontaneous breathing Cardiovascular status: stable Anesthetic complications: no   No notable events documented.  Last Vitals:  Vitals:   08/03/21 1430 08/03/21 1445  BP: (!) 145/90   Pulse: 68   Resp: 14   Temp: 36.6 C   SpO2: 94% 95%    Last Pain:  Vitals:   08/03/21 1430  TempSrc:   PainSc: 0-No pain                 Lewie Loron

## 2021-08-03 NOTE — Anesthesia Procedure Notes (Signed)
Procedure Name: Intubation Date/Time: 08/03/2021 11:50 AM Performed by: Claudia Desanctis, CRNA Pre-anesthesia Checklist: Patient identified, Emergency Drugs available, Suction available and Patient being monitored Patient Re-evaluated:Patient Re-evaluated prior to induction Oxygen Delivery Method: Circle system utilized Preoxygenation: Pre-oxygenation with 100% oxygen Induction Type: IV induction Ventilation: Mask ventilation without difficulty Laryngoscope Size: 2 and Miller Grade View: Grade I Tube type: Oral Number of attempts: 1 Airway Equipment and Method: Stylet Placement Confirmation: ETT inserted through vocal cords under direct vision, positive ETCO2 and breath sounds checked- equal and bilateral Tube secured with: Tape Dental Injury: Teeth and Oropharynx as per pre-operative assessment

## 2021-08-03 NOTE — Transfer of Care (Signed)
Immediate Anesthesia Transfer of Care Note  Patient: Candace Wright  Procedure(s) Performed: LEFT INFERIOR PARATHYROIDECTOMY (Left)  Patient Location: PACU  Anesthesia Type:GA combined with regional for post-op pain  Level of Consciousness: awake, alert , oriented and patient cooperative  Airway & Oxygen Therapy: Patient Spontanous Breathing and Patient connected to face mask  Post-op Assessment: Report given to RN and Post -op Vital signs reviewed and stable  Post vital signs: Reviewed and stable  Last Vitals:  Vitals Value Taken Time  BP 156/103 08/03/21 1256  Temp    Pulse 69 08/03/21 1257  Resp 12 08/03/21 1257  SpO2 95 % 08/03/21 1257  Vitals shown include unvalidated device data.  Last Pain:  Vitals:   08/03/21 0920  TempSrc:   PainSc: 0-No pain         Complications: No notable events documented.

## 2021-08-04 ENCOUNTER — Encounter (HOSPITAL_COMMUNITY): Payer: Self-pay | Admitting: Surgery

## 2021-08-06 LAB — SURGICAL PATHOLOGY

## 2022-02-05 ENCOUNTER — Other Ambulatory Visit: Payer: Self-pay | Admitting: Family Medicine

## 2022-02-05 ENCOUNTER — Ambulatory Visit
Admission: RE | Admit: 2022-02-05 | Discharge: 2022-02-05 | Disposition: A | Discharge status: Home / Self Care | Payer: No Typology Code available for payment source | Source: Ambulatory Visit | Attending: Family Medicine | Admitting: Family Medicine

## 2022-02-05 DIAGNOSIS — M545 Low back pain, unspecified: Secondary | ICD-10-CM

## 2022-06-07 ENCOUNTER — Other Ambulatory Visit: Payer: Self-pay | Admitting: Family Medicine

## 2022-06-07 DIAGNOSIS — E213 Hyperparathyroidism, unspecified: Secondary | ICD-10-CM

## 2022-06-07 DIAGNOSIS — Z1231 Encounter for screening mammogram for malignant neoplasm of breast: Secondary | ICD-10-CM

## 2022-07-31 ENCOUNTER — Other Ambulatory Visit (HOSPITAL_COMMUNITY): Payer: Self-pay | Admitting: Physician Assistant

## 2022-07-31 DIAGNOSIS — R1011 Right upper quadrant pain: Secondary | ICD-10-CM

## 2022-08-12 ENCOUNTER — Encounter (HOSPITAL_COMMUNITY): Payer: Self-pay

## 2022-08-12 ENCOUNTER — Ambulatory Visit (HOSPITAL_COMMUNITY): Payer: No Typology Code available for payment source

## 2022-11-12 IMAGING — US US THYROID
1 series · 13 of 25 positions shown · non-contrast
Comparison: None.

CLINICAL DATA: Thyroid nodule

EXAM:
THYROID ULTRASOUND
TECHNIQUE: Ultrasound examination of the thyroid gland and adjacent soft
tissues was performed.

[Series 1: us thyroid · 13 of 93 slices shown]
[im 1/93]
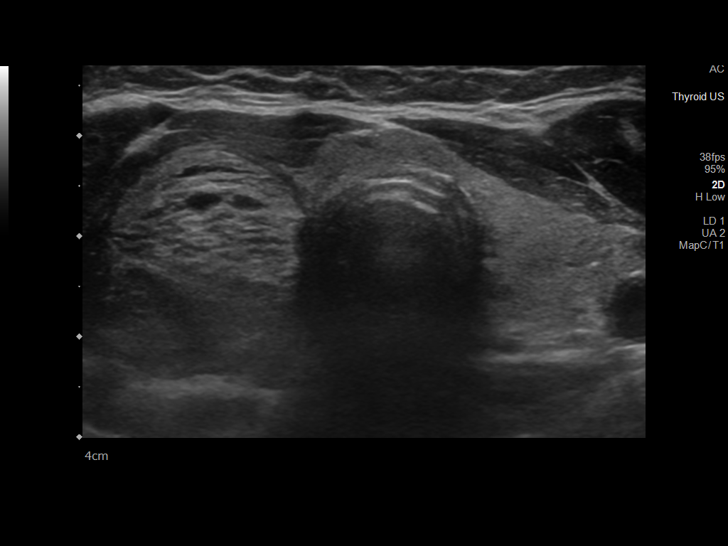
[im 8/93]
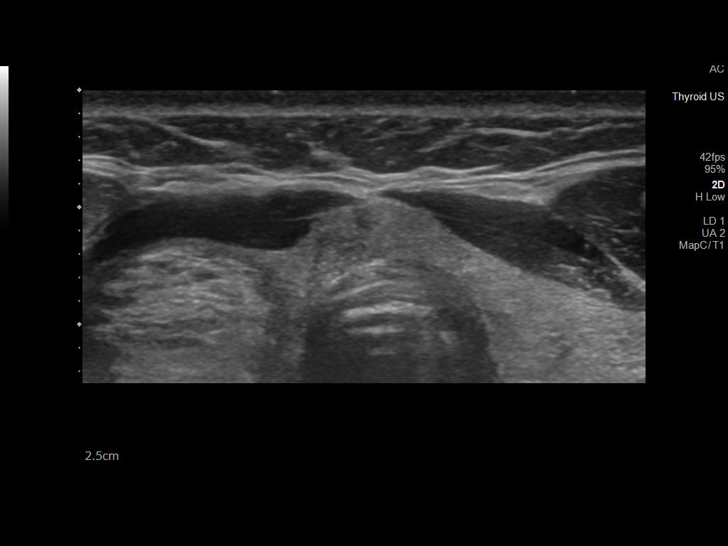
[im 16/93]
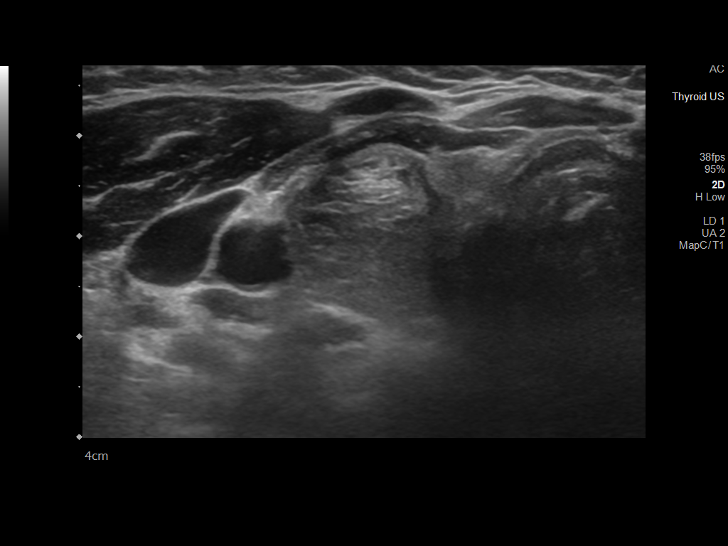
[im 24/93]
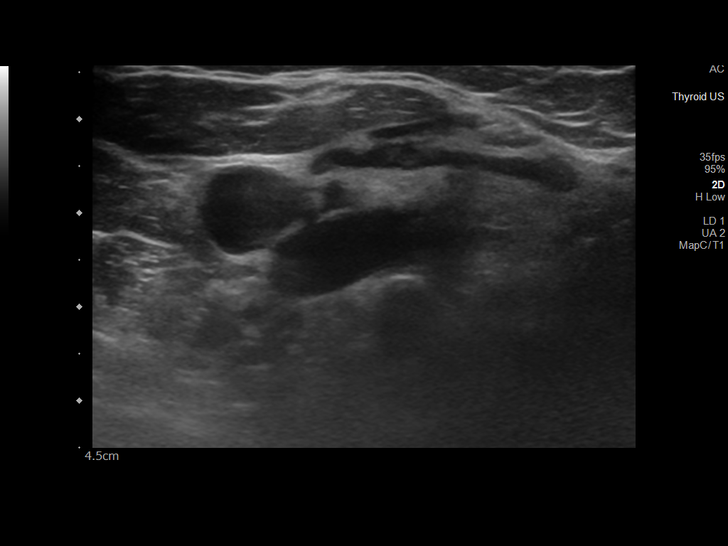
[im 31/93]
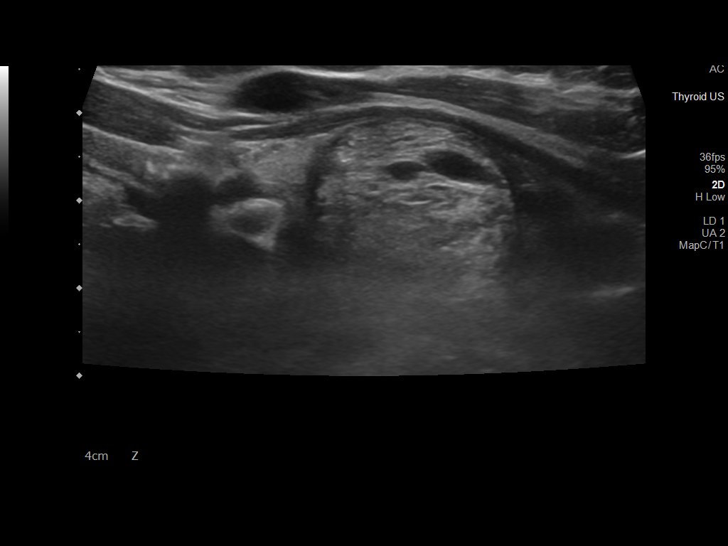
[im 39/93]
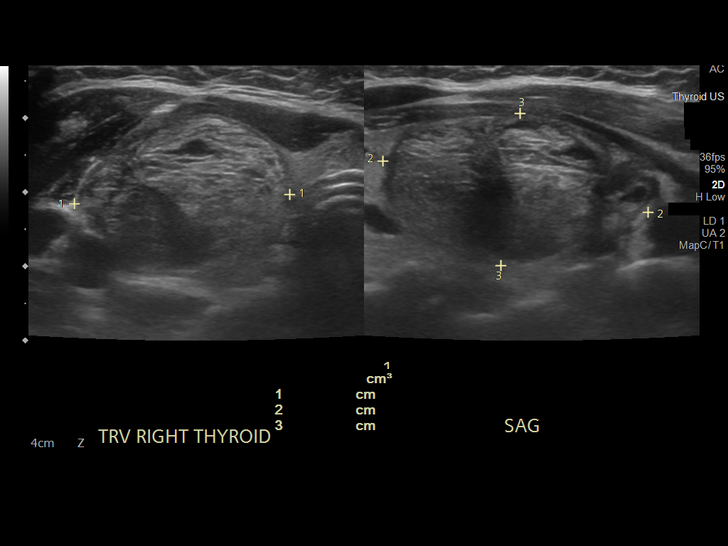
[im 47/93]
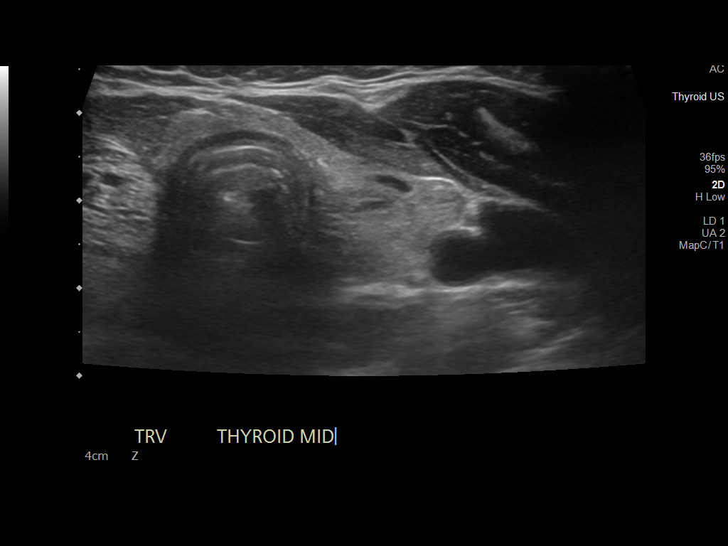
[im 54/93]
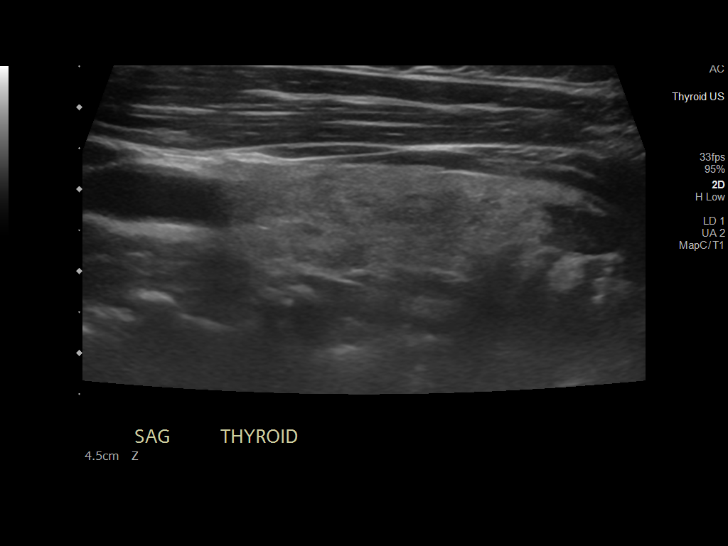
[im 62/93]
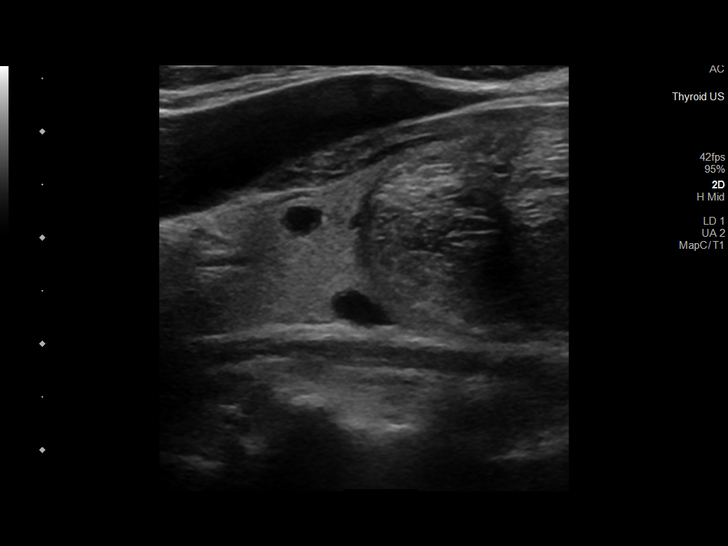
[im 70/93]
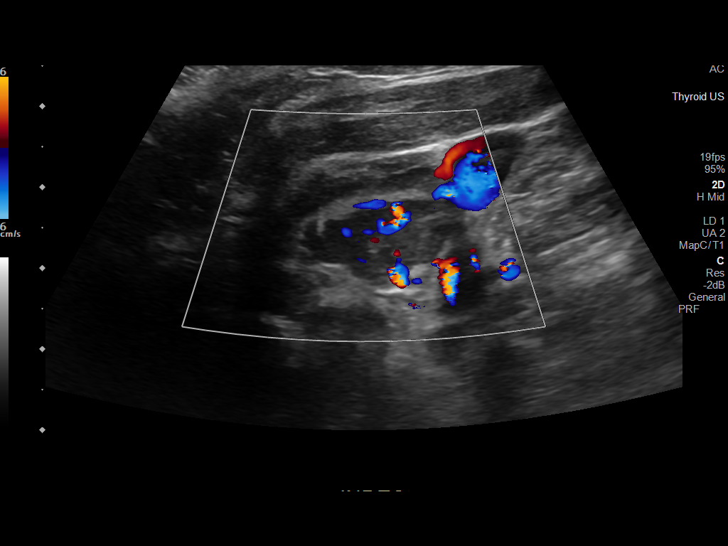
[im 77/93]
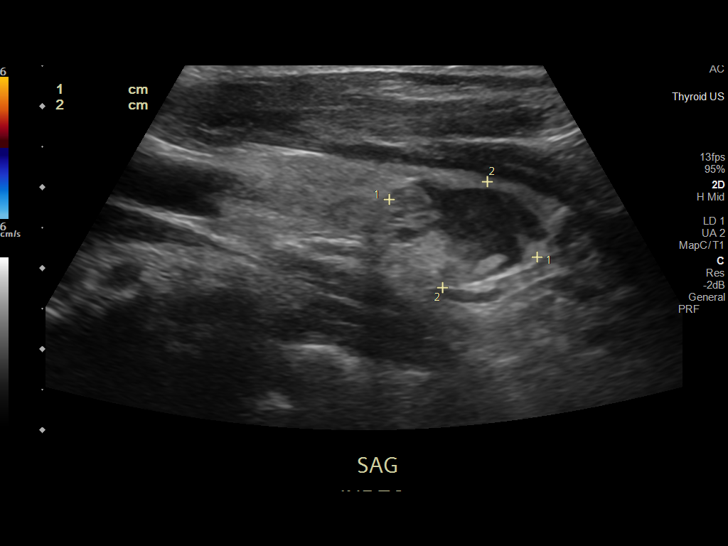
[im 85/93]
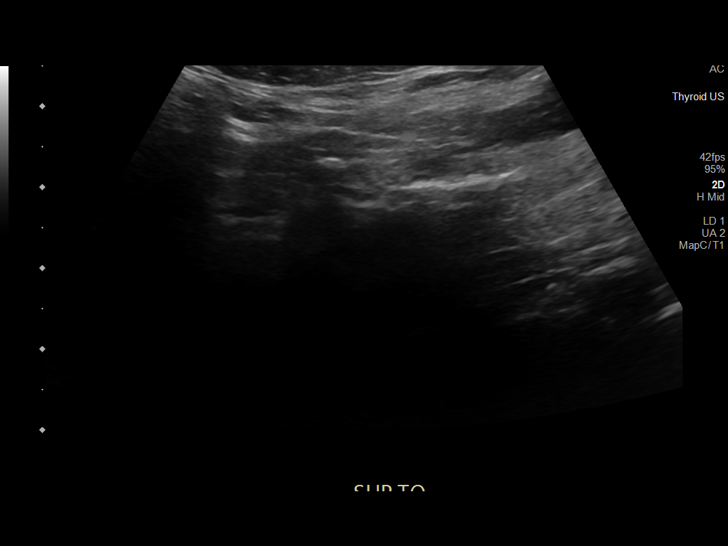
[im 93/93]
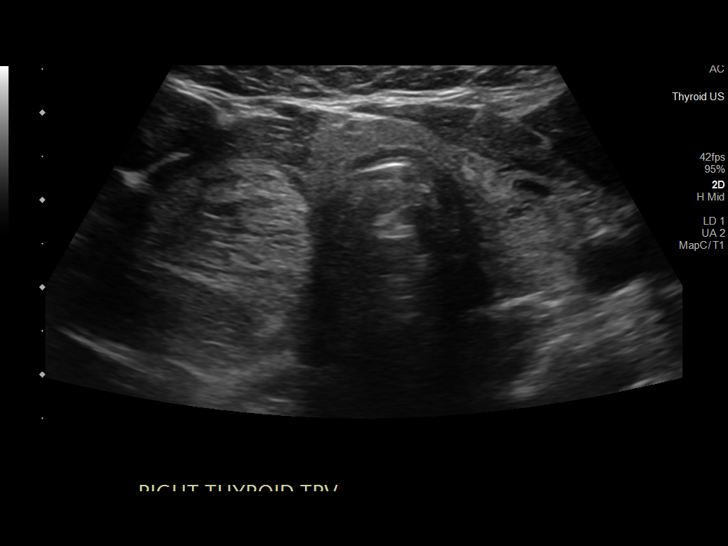

[13 of 25 positions shown; findings below may reference images not displayed]

FINDINGS: Parenchymal Echotexture: Mildly heterogeneous

Isthmus: 0.5 cm

Right lobe: 5.4 x 2.3 x 2.7 cm

Left lobe: 4.7 x 1.6 x 1.8 cm

_________________________________________________________

Estimated total number of nodules >/= 1 cm: 1

Number of spongiform nodules >/=  2 cm not described below (TR1): 0

Number of mixed cystic and solid nodules >/= 1.5 cm not described
below (TR2): 0

_________________________________________________________

Nodule 1: 3.6 x 2.1 x 2.9 cm spongiform nodule in the mid to
inferior right thyroid lobe does not meet criteria for imaging
surveillance or FNA.

_________________________________________________________

2.0 x 1.4 x 1.9 cm mixed solid cystic lesion seen in the left neck,
lateral to the left thyroid lobe.
IMPRESSION: 1. Solitary, spongiform right thyroid nodule does not meet criteria
for FNA or imaging follow-up.
2. 2.0 x 1.4 x 1.9 cm mixed solid cystic lesion in the left neck,
lateral and inferior to the left thyroid lobe may be related to a
parathyroid mass. Correlation with patient history and laboratory
examination should be performed for hyperparathyroidism. If
positive, further evaluation with nuclear medicine parathyroid scan
should be considered. If negative, further evaluation with contrast
enhanced soft tissue neck CT should be performed as this could also
represent a necrotic lymph node.

The above is in keeping with the ACR TI-RADS recommendations - [HOSPITAL] 1151;[DATE].

## 2022-11-12 IMAGING — NM NM PARATHYROID W/ SPECT
3 series · 8 of 8 positions shown · non-contrast
Comparison: Thyroid sonogram 06/28/2021

CLINICAL DATA: Hypercalcemia.  Evaluate for parathyroid adenoma.

EXAM:
NM PARATHYROID SCINTIGRAPHY AND SPECT IMAGING
TECHNIQUE: Following intravenous administration of radiopharmaceutical, early
and 2-hour delayed planar images were obtained in the anterior
projection. Delayed triplanar SPECT images were also obtained at 2
hours.
RADIOPHARMACEUTICALS:  25.2 mCi Rc-EEm Sestamibi IV

[Series 1: 15 min ant · 2.07mm/px · 1 of 1 slices shown]
[im 1/1]
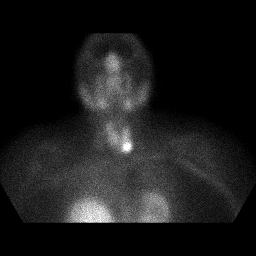

[Series 2: 2 hr ant · 2.07mm/px · 1 of 1 slices shown]
[im 1/1]
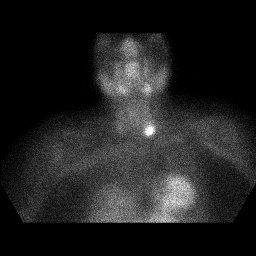

[Series 3: spect parathyroid · 4.14mm/px · 6 of 64 frames shown]
[frame 6/64  full-range]
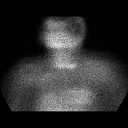
[frame 16/64  full-range]
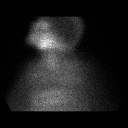
[frame 27/64  full-range]
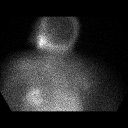
[frame 38/64  full-range]
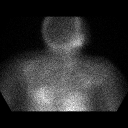
[frame 48/64  full-range]
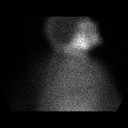
[frame 59/64  full-range]
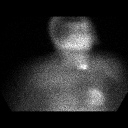

[8 of 8 positions shown; findings below may reference images not displayed]

FINDINGS: On the early anterior projection planar images there is tracer
uptake identified within both lobes of thyroid gland. Additionally,
there is a medium size focus of moderate to intense uptake above
background thyroid activity localizing to the area around the
inferior pole of left lobe of thyroid gland.

On the 2 hour delayed planar images there is washout of the
radiopharmaceutical from both lobes of thyroid gland. A persistent
focus of increased uptake is identified in the area of the inferior
pole of left lobe of thyroid gland which is compatible with
parathyroid adenoma.
IMPRESSION: 1. Suspected parathyroid adenoma localizes to the area around the
inferior pole of left lobe of thyroid gland.

## 2022-11-29 ENCOUNTER — Ambulatory Visit
Admission: RE | Admit: 2022-11-29 | Discharge: 2022-11-29 | Disposition: A | Discharge status: Home / Self Care | Payer: No Typology Code available for payment source | Source: Ambulatory Visit | Attending: Family Medicine | Admitting: Family Medicine

## 2022-11-29 DIAGNOSIS — Z1231 Encounter for screening mammogram for malignant neoplasm of breast: Secondary | ICD-10-CM

## 2022-11-29 DIAGNOSIS — E213 Hyperparathyroidism, unspecified: Secondary | ICD-10-CM

## 2023-01-17 ENCOUNTER — Other Ambulatory Visit: Payer: Self-pay

## 2023-01-17 MED ORDER — OZEMPIC (0.25 OR 0.5 MG/DOSE) 2 MG/3ML ~~LOC~~ SOPN
0.2500 mg | PEN_INJECTOR | SUBCUTANEOUS | 1 refills | Status: DC
  Filled 2023-01-17: qty 3, 56d supply, fill #0
  Filled 2023-03-05 – 2023-03-07 (×3): qty 3, 56d supply, fill #1

## 2023-02-12 ENCOUNTER — Other Ambulatory Visit: Payer: Self-pay

## 2023-03-06 ENCOUNTER — Other Ambulatory Visit: Payer: Self-pay

## 2023-03-07 ENCOUNTER — Other Ambulatory Visit: Payer: Self-pay

## 2023-04-14 ENCOUNTER — Other Ambulatory Visit: Payer: Self-pay

## 2023-04-14 MED ORDER — OZEMPIC (0.25 OR 0.5 MG/DOSE) 2 MG/3ML ~~LOC~~ SOPN
0.5000 mg | PEN_INJECTOR | SUBCUTANEOUS | 1 refills | Status: DC
  Filled 2023-04-14 – 2023-04-28 (×2): qty 3, 28d supply, fill #0
  Filled 2023-06-02: qty 3, 28d supply, fill #1

## 2023-04-28 ENCOUNTER — Other Ambulatory Visit: Payer: Self-pay

## 2023-06-02 ENCOUNTER — Other Ambulatory Visit: Payer: Self-pay

## 2023-06-04 ENCOUNTER — Other Ambulatory Visit: Payer: Self-pay

## 2023-06-06 ENCOUNTER — Other Ambulatory Visit: Payer: Self-pay

## 2023-06-17 ENCOUNTER — Other Ambulatory Visit: Payer: Self-pay

## 2023-06-17 MED ORDER — NITROFURANTOIN MONOHYD MACRO 100 MG PO CAPS
100.0000 mg | ORAL_CAPSULE | Freq: Two times a day (BID) | ORAL | 0 refills | Status: AC
  Filled 2023-06-17: qty 14, 7d supply, fill #0

## 2023-07-23 ENCOUNTER — Other Ambulatory Visit: Payer: Self-pay

## 2023-07-24 ENCOUNTER — Other Ambulatory Visit: Payer: Self-pay

## 2023-07-24 MED ORDER — OZEMPIC (0.25 OR 0.5 MG/DOSE) 2 MG/3ML ~~LOC~~ SOPN
0.5000 mg | PEN_INJECTOR | SUBCUTANEOUS | 0 refills | Status: DC
  Filled 2023-07-24: qty 3, 28d supply, fill #0

## 2023-07-25 ENCOUNTER — Other Ambulatory Visit: Payer: Self-pay

## 2023-08-05 ENCOUNTER — Other Ambulatory Visit: Payer: Self-pay

## 2023-08-30 ENCOUNTER — Other Ambulatory Visit: Payer: Self-pay

## 2023-09-01 ENCOUNTER — Other Ambulatory Visit: Payer: Self-pay

## 2023-09-01 MED ORDER — OZEMPIC (0.25 OR 0.5 MG/DOSE) 2 MG/3ML ~~LOC~~ SOPN
0.5000 mg | PEN_INJECTOR | SUBCUTANEOUS | 0 refills | Status: DC
  Filled 2023-09-04: qty 3, 28d supply, fill #0

## 2023-09-04 ENCOUNTER — Other Ambulatory Visit: Payer: Self-pay

## 2023-09-28 ENCOUNTER — Other Ambulatory Visit: Payer: Self-pay

## 2023-09-29 ENCOUNTER — Other Ambulatory Visit: Payer: Self-pay

## 2023-09-29 MED ORDER — OZEMPIC (0.25 OR 0.5 MG/DOSE) 2 MG/3ML ~~LOC~~ SOPN
0.5000 mg | PEN_INJECTOR | SUBCUTANEOUS | 3 refills | Status: AC
  Filled 2023-09-29: qty 3, 28d supply, fill #0

## 2023-09-30 ENCOUNTER — Other Ambulatory Visit: Payer: Self-pay

## 2023-10-01 ENCOUNTER — Other Ambulatory Visit: Payer: Self-pay

## 2023-10-27 ENCOUNTER — Other Ambulatory Visit: Payer: Self-pay

## 2023-10-27 MED ORDER — OZEMPIC (1 MG/DOSE) 4 MG/3ML ~~LOC~~ SOPN
1.0000 mg | PEN_INJECTOR | SUBCUTANEOUS | 0 refills | Status: DC
  Filled 2023-10-27: qty 3, 28d supply, fill #0

## 2023-10-28 ENCOUNTER — Other Ambulatory Visit: Payer: Self-pay

## 2023-11-25 ENCOUNTER — Other Ambulatory Visit: Payer: Self-pay

## 2023-11-26 ENCOUNTER — Other Ambulatory Visit: Payer: Self-pay

## 2023-11-26 MED ORDER — OZEMPIC (1 MG/DOSE) 4 MG/3ML ~~LOC~~ SOPN
1.0000 mg | PEN_INJECTOR | SUBCUTANEOUS | 1 refills | Status: AC
  Filled 2023-11-26: qty 3, 28d supply, fill #0

## 2023-11-26 MED ORDER — OZEMPIC (1 MG/DOSE) 4 MG/3ML ~~LOC~~ SOPN
1.0000 mg | PEN_INJECTOR | SUBCUTANEOUS | 0 refills | Status: AC
  Filled 2023-11-26: qty 3, 28d supply, fill #0

## 2023-11-27 ENCOUNTER — Other Ambulatory Visit: Payer: Self-pay

## 2023-12-08 ENCOUNTER — Other Ambulatory Visit: Payer: Self-pay

## 2023-12-08 MED ORDER — OZEMPIC (2 MG/DOSE) 8 MG/3ML ~~LOC~~ SOPN
2.0000 mg | PEN_INJECTOR | SUBCUTANEOUS | 0 refills | Status: DC
  Filled 2023-12-08 – 2023-12-25 (×2): qty 3, 28d supply, fill #0

## 2023-12-26 ENCOUNTER — Other Ambulatory Visit: Payer: Self-pay

## 2024-01-19 ENCOUNTER — Other Ambulatory Visit: Payer: Self-pay

## 2024-01-21 ENCOUNTER — Other Ambulatory Visit: Payer: Self-pay

## 2024-01-21 MED ORDER — OZEMPIC (2 MG/DOSE) 8 MG/3ML ~~LOC~~ SOPN
2.0000 mg | PEN_INJECTOR | SUBCUTANEOUS | 0 refills | Status: DC
  Filled 2024-01-21: qty 3, 28d supply, fill #0

## 2024-02-15 ENCOUNTER — Other Ambulatory Visit: Payer: Self-pay

## 2024-02-16 ENCOUNTER — Other Ambulatory Visit: Payer: Self-pay

## 2024-02-16 MED ORDER — OZEMPIC (2 MG/DOSE) 8 MG/3ML ~~LOC~~ SOPN
2.0000 mg | PEN_INJECTOR | SUBCUTANEOUS | 0 refills | Status: DC
  Filled 2024-02-16: qty 9, 84d supply, fill #0

## 2024-02-17 ENCOUNTER — Other Ambulatory Visit: Payer: Self-pay

## 2024-02-24 ENCOUNTER — Other Ambulatory Visit: Payer: Self-pay

## 2024-03-10 ENCOUNTER — Other Ambulatory Visit: Payer: Self-pay

## 2024-03-25 ENCOUNTER — Other Ambulatory Visit: Payer: Self-pay

## 2024-05-11 ENCOUNTER — Other Ambulatory Visit: Payer: Self-pay

## 2024-05-12 ENCOUNTER — Other Ambulatory Visit: Payer: Self-pay

## 2024-05-12 MED ORDER — OZEMPIC (2 MG/DOSE) 8 MG/3ML ~~LOC~~ SOPN
2.0000 mg | PEN_INJECTOR | SUBCUTANEOUS | 0 refills | Status: DC
  Filled 2024-05-12: qty 9, 84d supply, fill #0

## 2024-05-13 ENCOUNTER — Other Ambulatory Visit: Payer: Self-pay

## 2024-05-21 ENCOUNTER — Other Ambulatory Visit: Payer: Self-pay

## 2024-06-11 ENCOUNTER — Other Ambulatory Visit: Payer: Self-pay

## 2024-06-11 MED ORDER — AMOXICILLIN-POT CLAVULANATE 875-125 MG PO TABS
1.0000 | ORAL_TABLET | Freq: Two times a day (BID) | ORAL | 0 refills | Status: AC
  Filled 2024-06-11: qty 14, 7d supply, fill #0

## 2024-08-02 ENCOUNTER — Other Ambulatory Visit: Payer: Self-pay

## 2024-08-02 MED ORDER — OZEMPIC (2 MG/DOSE) 8 MG/3ML ~~LOC~~ SOPN
2.0000 mg | PEN_INJECTOR | SUBCUTANEOUS | 0 refills | Status: AC
  Filled 2024-08-06: qty 9, 84d supply, fill #0

## 2024-08-06 ENCOUNTER — Other Ambulatory Visit: Payer: Self-pay
